# Patient Record
Sex: Female | Born: 1970 | Race: White | Hispanic: No | Marital: Single | Smoking: Never smoker
Health system: Southern US, Community
[De-identification: ages and names within clinical notes are randomized; demographics above are authoritative.]

## PROBLEM LIST (undated history)

## (undated) DIAGNOSIS — L270 Generalized skin eruption due to drugs and medicaments taken internally: Secondary | ICD-10-CM

## (undated) DIAGNOSIS — B9562 Methicillin resistant Staphylococcus aureus infection as the cause of diseases classified elsewhere: Secondary | ICD-10-CM

## (undated) DIAGNOSIS — F32A Depression, unspecified: Secondary | ICD-10-CM

## (undated) DIAGNOSIS — L0390 Cellulitis, unspecified: Secondary | ICD-10-CM

## (undated) DIAGNOSIS — E079 Disorder of thyroid, unspecified: Secondary | ICD-10-CM

## (undated) DIAGNOSIS — F329 Major depressive disorder, single episode, unspecified: Secondary | ICD-10-CM

## (undated) DIAGNOSIS — F419 Anxiety disorder, unspecified: Secondary | ICD-10-CM

## (undated) DIAGNOSIS — C801 Malignant (primary) neoplasm, unspecified: Secondary | ICD-10-CM

## (undated) HISTORY — DX: Cellulitis, unspecified: L03.90

## (undated) HISTORY — PX: BREAST SURGERY: SHX581

## (undated) HISTORY — DX: Malignant (primary) neoplasm, unspecified: C80.1

## (undated) HISTORY — PX: BILATERAL OOPHORECTOMY: SHX1221

## (undated) HISTORY — DX: Generalized skin eruption due to drugs and medicaments taken internally: L27.0

## (undated) HISTORY — DX: Major depressive disorder, single episode, unspecified: F32.9

## (undated) HISTORY — DX: Disorder of thyroid, unspecified: E07.9

## (undated) HISTORY — PX: ABDOMINAL HYSTERECTOMY: SHX81

## (undated) HISTORY — DX: Methicillin resistant Staphylococcus aureus infection as the cause of diseases classified elsewhere: B95.62

## (undated) HISTORY — DX: Anxiety disorder, unspecified: F41.9

## (undated) HISTORY — DX: Depression, unspecified: F32.A

---

## 2007-01-27 ENCOUNTER — Ambulatory Visit: Payer: Self-pay | Admitting: Otolaryngology

## 2007-02-01 ENCOUNTER — Ambulatory Visit: Payer: Self-pay | Admitting: Otolaryngology

## 2008-01-02 ENCOUNTER — Ambulatory Visit: Payer: Self-pay | Admitting: Unknown Physician Specialty

## 2008-03-24 ENCOUNTER — Emergency Department: Payer: Self-pay | Admitting: Emergency Medicine

## 2009-03-25 ENCOUNTER — Ambulatory Visit: Payer: Self-pay | Admitting: Oncology

## 2009-04-24 ENCOUNTER — Ambulatory Visit: Payer: Self-pay | Admitting: Oncology

## 2009-05-12 IMAGING — US US PELV - US TRANSVAGINAL
1 series · 17 of 25 positions shown · non-contrast
Comparison: none

REASON FOR EXAM: ATN BILA OVARIES   BRCA mutation
COMMENTS:

[Series 1: us pelv - us transvaginal · 17 of 64 slices shown]
[im 1/64]
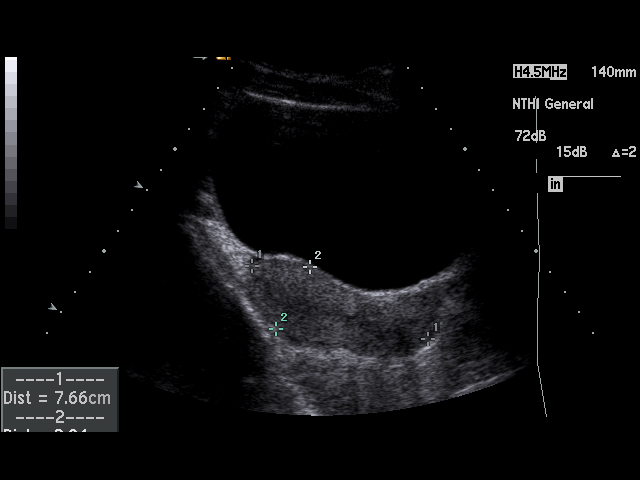
[im 6/64]
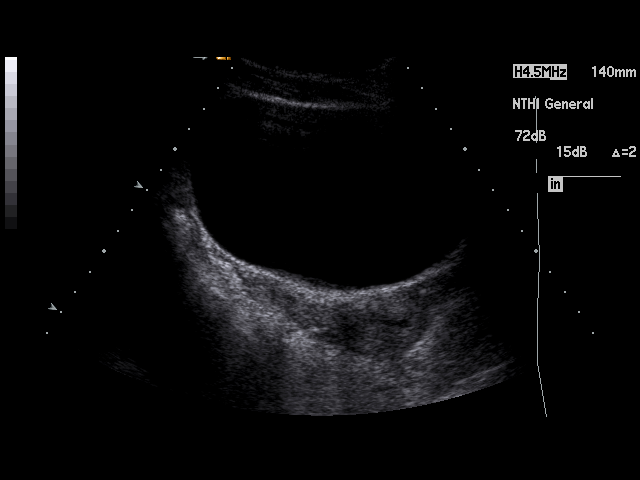
[im 8/64]
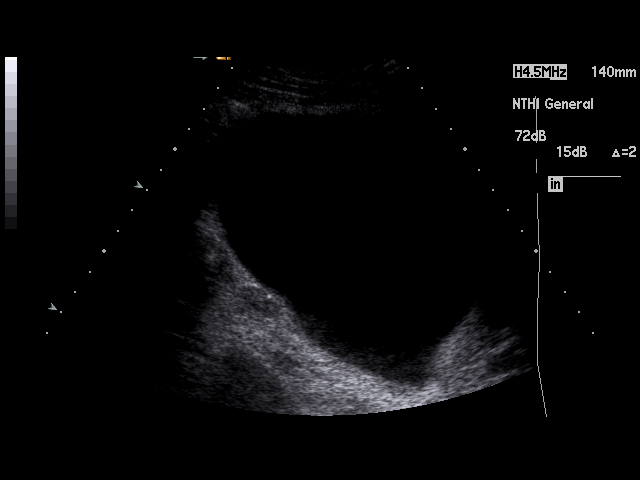
[im 14/64]
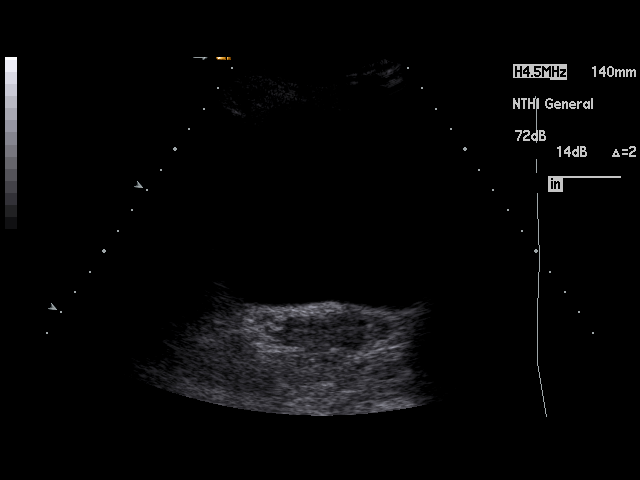
[im 16/64]
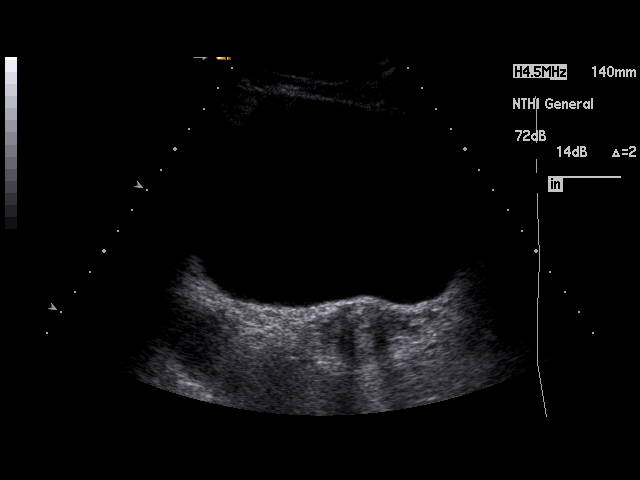
[im 22/64]
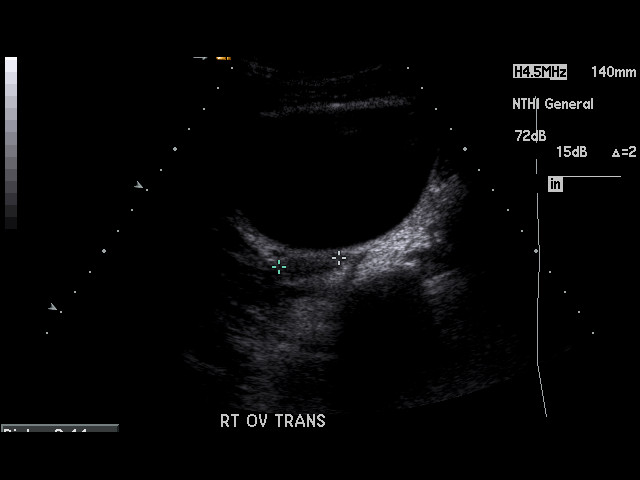
[im 24/64]
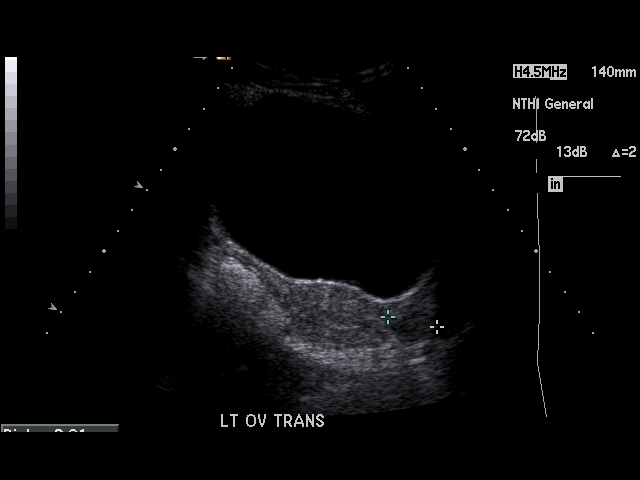
[im 29/64]
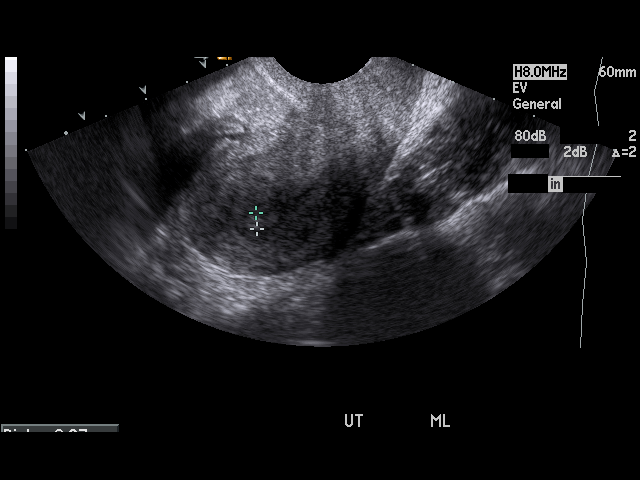
[im 32/64]
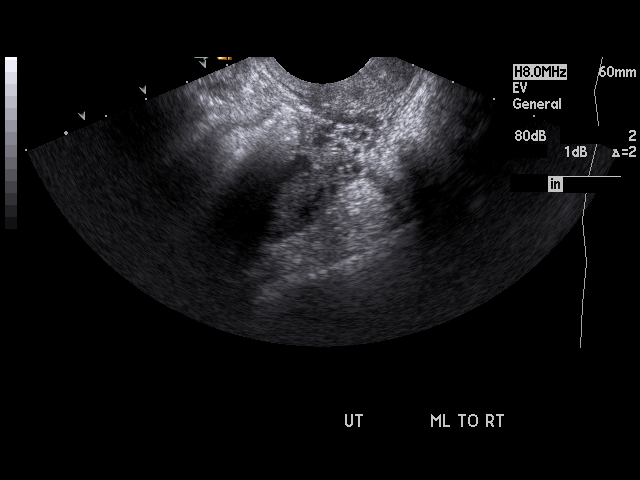
[im 35/64]
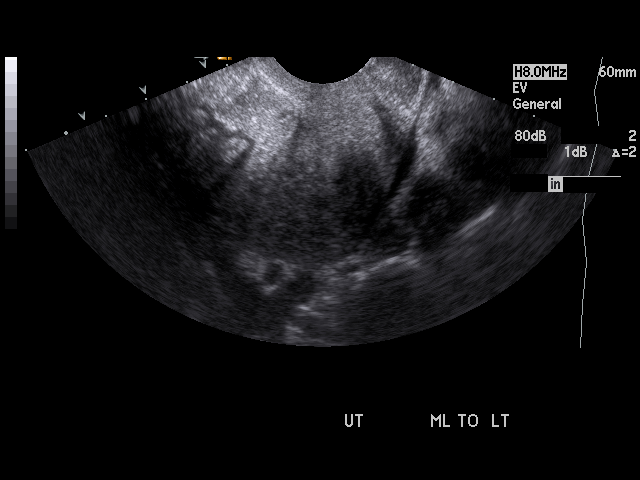
[im 40/64]
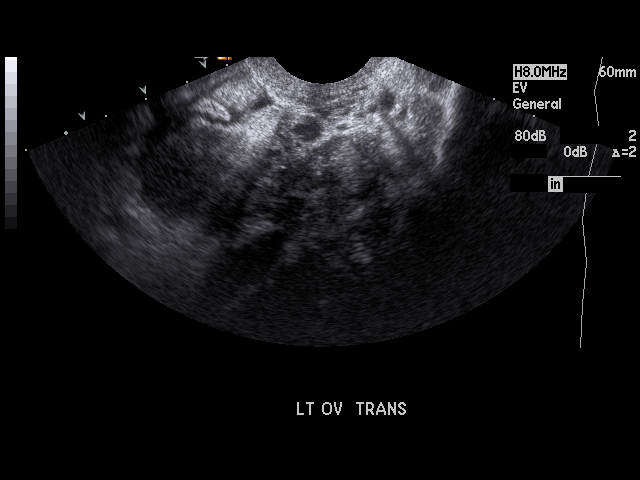
[im 43/64]
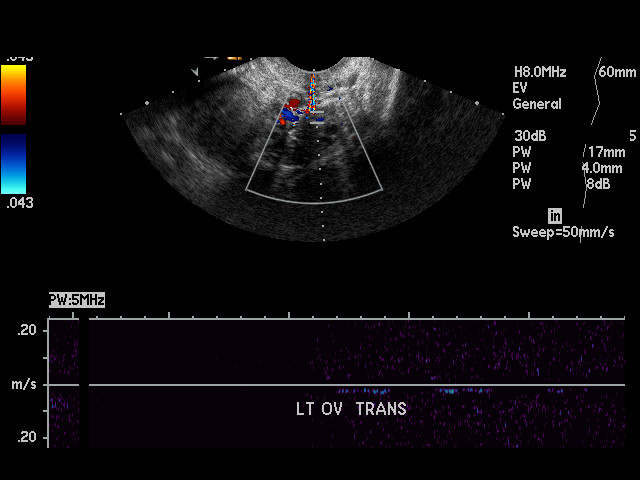
[im 48/64]
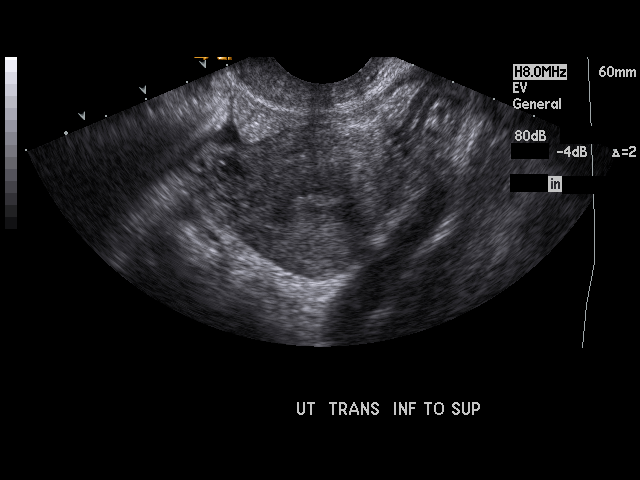
[im 50/64]
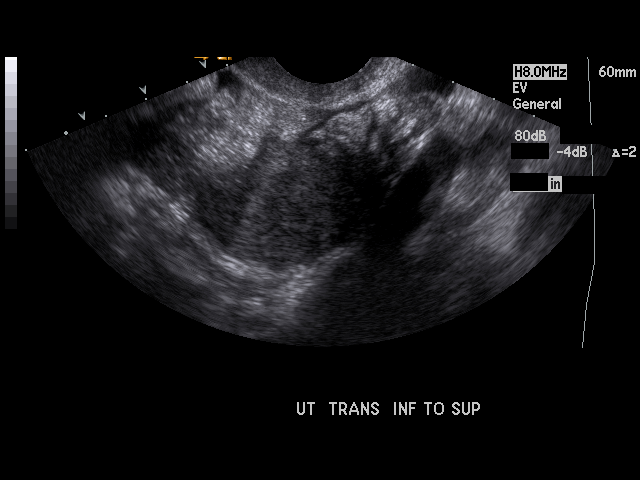
[im 56/64]
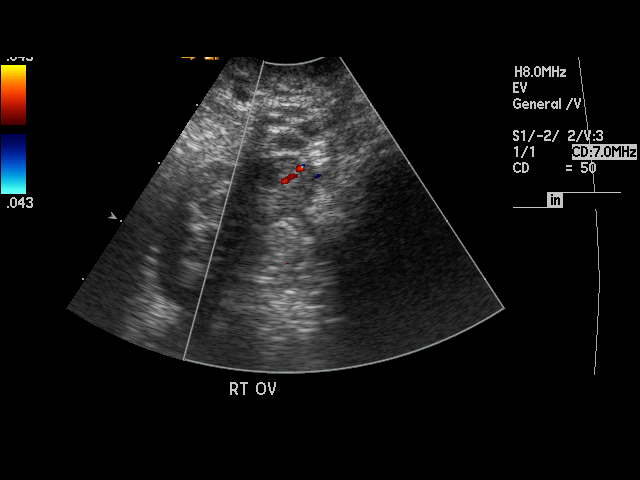
[im 58/64]
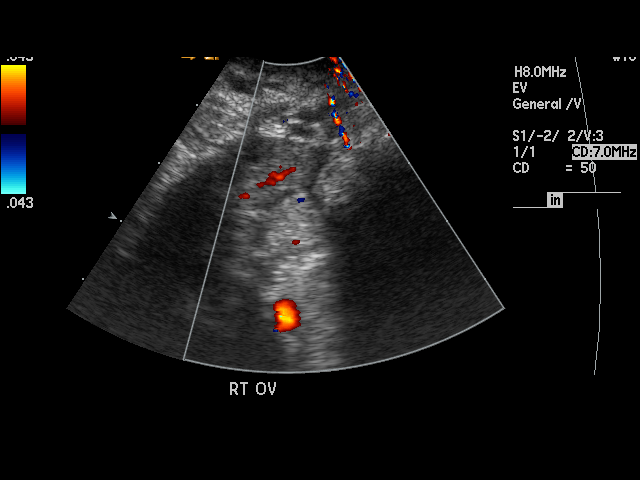
[im 64/64]
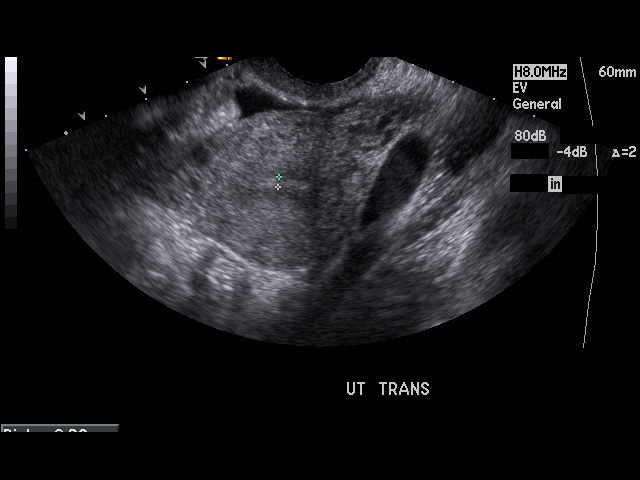

[17 of 25 positions shown; findings below may reference images not displayed]

PROCEDURE:     US  - US PELVIS EXAM W/TRANSVAGINAL  - [DATE]  [DATE]

RESULT:     Transabdominal and endovaginal ultrasound was performed. The
uterus measures 7.66 cm [92] cm x 4.14 cm. No uterine mass lesions are
seen. The endometrium measures 3.7 mm in thickness. The right and left
ovaries are visualized. The right ovary measures 1.85 cm at maximum diameter
and the left ovary measures 2.37 cm at maximum diameter. Vascular flow is
seen in each ovary. No abnormal adnexal masses are identified. No free fluid
is noted in the pelvis. The visualized portion of the urinary bladder is
normal in appearance. The right and left kidneys are visualized and show no
significant abnormalities.
IMPRESSION: No significant abnormalities are noted.

## 2009-05-25 ENCOUNTER — Ambulatory Visit: Payer: Self-pay | Admitting: Oncology

## 2009-06-25 ENCOUNTER — Ambulatory Visit: Payer: Self-pay | Admitting: Oncology

## 2011-02-23 DIAGNOSIS — L270 Generalized skin eruption due to drugs and medicaments taken internally: Secondary | ICD-10-CM

## 2011-02-23 DIAGNOSIS — B9562 Methicillin resistant Staphylococcus aureus infection as the cause of diseases classified elsewhere: Secondary | ICD-10-CM

## 2011-02-23 HISTORY — DX: Methicillin resistant Staphylococcus aureus infection as the cause of diseases classified elsewhere: B95.62

## 2011-02-23 HISTORY — DX: Generalized skin eruption due to drugs and medicaments taken internally: L27.0

## 2011-07-26 DIAGNOSIS — E079 Disorder of thyroid, unspecified: Secondary | ICD-10-CM

## 2011-07-26 HISTORY — DX: Disorder of thyroid, unspecified: E07.9

## 2011-09-01 ENCOUNTER — Ambulatory Visit: Payer: Self-pay | Admitting: Internal Medicine

## 2011-09-21 ENCOUNTER — Ambulatory Visit (INDEPENDENT_AMBULATORY_CARE_PROVIDER_SITE_OTHER): Payer: PRIVATE HEALTH INSURANCE | Admitting: Internal Medicine

## 2011-09-21 ENCOUNTER — Encounter: Payer: Self-pay | Admitting: Internal Medicine

## 2011-09-21 VITALS — BP 104/60 | HR 66 | Temp 98.4°F | Resp 14 | Ht 62.5 in | Wt 103.8 lb

## 2011-09-21 DIAGNOSIS — E8941 Symptomatic postprocedural ovarian failure: Secondary | ICD-10-CM

## 2011-09-21 DIAGNOSIS — B9562 Methicillin resistant Staphylococcus aureus infection as the cause of diseases classified elsewhere: Secondary | ICD-10-CM | POA: Insufficient documentation

## 2011-09-21 DIAGNOSIS — E8940 Asymptomatic postprocedural ovarian failure: Secondary | ICD-10-CM

## 2011-09-21 MED ORDER — ESTROGENS CONJUGATED 0.625 MG PO TABS: 0.6250 mg | ORAL_TABLET | Freq: Every day | ORAL | Status: DC

## 2011-09-21 MED ORDER — EPINEPHRINE 0.3 MG/0.3ML IJ DEVI: 0.3000 mg | Freq: Once | INTRAMUSCULAR | Status: AC
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
## 2011-09-22 ENCOUNTER — Encounter: Payer: Self-pay | Admitting: Internal Medicine

## 2011-09-22 DIAGNOSIS — L270 Generalized skin eruption due to drugs and medicaments taken internally: Secondary | ICD-10-CM | POA: Insufficient documentation

## 2011-09-22 DIAGNOSIS — F411 Generalized anxiety disorder: Secondary | ICD-10-CM | POA: Insufficient documentation

## 2011-09-22 DIAGNOSIS — F329 Major depressive disorder, single episode, unspecified: Secondary | ICD-10-CM | POA: Insufficient documentation

## 2011-09-22 DIAGNOSIS — F419 Anxiety disorder, unspecified: Secondary | ICD-10-CM | POA: Insufficient documentation

## 2012-06-05 ENCOUNTER — Encounter: Payer: Self-pay | Admitting: Internal Medicine

## 2012-06-05 ENCOUNTER — Ambulatory Visit: Payer: Self-pay | Admitting: Internal Medicine

## 2012-06-05 ENCOUNTER — Ambulatory Visit (INDEPENDENT_AMBULATORY_CARE_PROVIDER_SITE_OTHER): Payer: PRIVATE HEALTH INSURANCE | Admitting: Internal Medicine

## 2012-06-05 VITALS — BP 98/60 | HR 73 | Temp 98.0°F | Resp 14 | Wt 103.5 lb

## 2012-06-05 DIAGNOSIS — N760 Acute vaginitis: Secondary | ICD-10-CM | POA: Insufficient documentation

## 2012-06-05 DIAGNOSIS — Z803 Family history of malignant neoplasm of breast: Secondary | ICD-10-CM | POA: Insufficient documentation

## 2012-06-05 DIAGNOSIS — N898 Other specified noninflammatory disorders of vagina: Secondary | ICD-10-CM

## 2012-06-05 DIAGNOSIS — B9689 Other specified bacterial agents as the cause of diseases classified elsewhere: Secondary | ICD-10-CM | POA: Insufficient documentation

## 2012-06-05 DIAGNOSIS — M546 Pain in thoracic spine: Secondary | ICD-10-CM

## 2012-06-05 DIAGNOSIS — F329 Major depressive disorder, single episode, unspecified: Secondary | ICD-10-CM

## 2012-06-05 MED ORDER — NABUMETONE 750 MG PO TABS: 750.0000 mg | ORAL_TABLET | Freq: Two times a day (BID) | ORAL | Status: AC

## 2012-06-05 MED ORDER — METHOCARBAMOL 500 MG PO TABS: 500.0000 mg | ORAL_TABLET | Freq: Three times a day (TID) | ORAL | Status: AC

## 2012-06-05 NOTE — Assessment & Plan Note (Addendum)
Managed by Dr. Manson Passey with prozac. Major symptom is insomnia, managed with ambien.

## 2012-06-05 NOTE — Assessment & Plan Note (Addendum)
She has scoliosis and rhomboid muscle spasm on exam.  Trial of relafen and MR for use prior to massage .  Thoracic spine films ordered.  Will rec PT to strengthen muscles.

## 2012-06-05 NOTE — Progress Notes (Addendum)
Patient ID: Jill Mays, female   DOB: April 05, 1971, 41 y.o.   MRN: [id number]  Patient Active Problem List  Diagnosis  . Cellulitis due to methicillin-resistant Staphylococcus aureus (MRSA)  . 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 Depression  . Anxiety  . breast cancer  . Back pain, thoracic  . Vaginitis    Subjective:  CC:   Chief Complaint  Patient presents with  . Back Pain  . Vaginal Discharge    HPI:   Jill Mays a 41 y.o. female who presents with vaginal discharge for the past week without pruritus .  No recent sexual activity and antibiotic use.   2) persistent back pain since her mastectomy.  Pain is located in her scapular region, paraspinous muscles affected.  No radiation .  She has tried massage and NSAIDs without real relief.  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    Past Medical History  Diagnosis Date  . Cellulitis due to methicillin-resistant Staphylococcus aureus (MRSA) May 2012    of breast (post reconstruction) presumed Strep or Staph  . 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 Thyroid disease Oct 2012    elevated TSH per patient   . Depression   . Anxiety   . breast cancer     s/p bilateral mastectomy TAH/BSO    Past Surgical History  Procedure Date  . Abdominal hysterectomy   . Breast surgery   . Bilateral oophorectomy          The following portions of the patient's history were reviewed and updated as appropriate: Allergies, current medications, and problem list.    Review of Systems:   12 Pt  review of systems was negative except those addressed in the HPI,     History   Social History  . Marital Status: Divorced    Spouse Name: N/A    Number of Children: N/A  . Years of Education: N/A   Occupational History  . Not on file.   Social History Main Topics  . Smoking status: Never Smoker   . Smokeless tobacco: Never Used  . Alcohol Use: Yes  . Drug Use: No   . Sexually Active: Not on file   Other Topics Concern  . Not on file   Social History Narrative  . No narrative on file    Objective:  BP 98/60  Pulse 73  Temp 98 F (36.7 C) (Oral)  Resp 14  Wt 103 lb 8 oz (46.947 kg)  SpO2 98%  General appearance: alert, cooperative and appears stated age Back: mild scoliosis to the left. paraspinous muscle spasm, rhomboid muscle spasm.  Lungs: clear to auscultation bilaterally Heart: regular rate and rhythm, S1, S2 normal, no murmur, click, rub or gallop Abdomen: soft, non-tender; bowel sounds normal; no masses,  no organomegaly Pulses: 2+ and symmetric Skin: Skin color, texture, turgor normal. No rashes or lesions GYN: vaginal vault without erythema. White thin discharge, slight fishy odor. Lymph nodes: Cervical, supraclavicular, and axillary nodes normal.  Assessment and Plan:  Back pain, thoracic She has scoliosis and rhomboid muscle spasm on exam.  Trial of relafen and MR for use prior to massage .  Thoracic spine films ordered.  Will rec PT to strengthen muscles.   Depression Managed by Dr. Manson Passey with prozac. Major symptom is insomnia, managed with ambien.   Vaginitis No history of sexual activity or antibiotics,  Likely gardnerella, but  will wait for culture.    Updated Medication List Outpatient Encounter Prescriptions as of 06/05/2012  Medication Sig Dispense Refill  . Ascorbic Acid (VITAMIN C) 100 MG tablet Take 100 mg by mouth daily.        . Calcium Carbonate-Vitamin D (CALCIUM + D) 600-200 MG-UNIT TABS Take by mouth.        . EPINEPHrine (EPIPEN) 0.3 mg/0.3 mL DEVI Inject 0.3 mLs (0.3 mg total) into the muscle once.  1 Device  1  . estrogens, conjugated, (PREMARIN) 0.625 MG tablet Take 1 tablet (0.625 mg total) by mouth daily. Take daily for 21 days then do not take for 7 days.  90 tablet  3  . FLUoxetine (PROZAC) 40 MG capsule Take 40 mg by mouth daily.        . Multiple Vitamin (MULTIVITAMIN) tablet Take 1 tablet by  mouth daily.        Marland Kitchen zolpidem (AMBIEN) 5 MG tablet Take 5 mg by mouth at bedtime as needed.        . methocarbamol (ROBAXIN) 500 MG tablet Take 1 tablet (500 mg total) by mouth 3 (three) times daily. As needed for muscle spasm  30 tablet  1  . nabumetone (RELAFEN) 750 MG tablet Take 1 tablet (750 mg total) by mouth 2 (two) times daily.  60 tablet  2     Orders Placed This Encounter  Procedures  . Culture, routine-genital  . Culture, routine-genital  . DG Thoracic Spine W/Swimmers    No Follow-up on file.

## 2012-06-05 NOTE — Assessment & Plan Note (Signed)
No history of sexual activity or antibiotics,  Likely gardnerella, but will wait for culture.

## 2012-06-06 ENCOUNTER — Telehealth: Payer: Self-pay | Admitting: Internal Medicine

## 2012-06-06 DIAGNOSIS — M546 Pain in thoracic spine: Secondary | ICD-10-CM

## 2012-06-06 NOTE — Telephone Encounter (Signed)
Left message notifying patient.

## 2012-06-06 NOTE — Telephone Encounter (Signed)
Thoracic spien films normal. .  PT referral in process.

## 2012-06-15 ENCOUNTER — Encounter: Payer: Self-pay | Admitting: Internal Medicine

## 2012-06-19 ENCOUNTER — Encounter: Payer: Self-pay | Admitting: Internal Medicine

## 2012-06-25 ENCOUNTER — Encounter: Payer: Self-pay | Admitting: Internal Medicine

## 2012-07-25 ENCOUNTER — Encounter: Payer: Self-pay | Admitting: Internal Medicine

## 2012-08-01 ENCOUNTER — Telehealth: Payer: Self-pay | Admitting: Internal Medicine

## 2012-08-01 NOTE — Telephone Encounter (Signed)
Pt came in today and needed refill on  prozac (generic) 40mg  [facility] Pt is compeltely out of her meds Has been trying to leave voice mail on refill

## 2012-08-02 MED ORDER — FLUOXETINE HCL 40 MG PO CAPS: 40.0000 mg | ORAL_CAPSULE | Freq: Every day | ORAL | Status: DC

## 2012-08-02 NOTE — Telephone Encounter (Signed)
90 days with one refill sent to [facility]

## 2012-08-30 ENCOUNTER — Encounter: Payer: Self-pay | Admitting: Internal Medicine

## 2012-08-30 ENCOUNTER — Ambulatory Visit (INDEPENDENT_AMBULATORY_CARE_PROVIDER_SITE_OTHER): Payer: PRIVATE HEALTH INSURANCE | Admitting: Internal Medicine

## 2012-08-30 VITALS — BP 112/60 | HR 65 | Temp 97.8°F | Resp 12 | Ht 62.5 in | Wt 105.5 lb

## 2012-08-30 DIAGNOSIS — M546 Pain in thoracic spine: Secondary | ICD-10-CM

## 2012-08-30 DIAGNOSIS — F32A Depression, unspecified: Secondary | ICD-10-CM

## 2012-08-30 DIAGNOSIS — F419 Anxiety disorder, unspecified: Secondary | ICD-10-CM

## 2012-08-30 DIAGNOSIS — E8940 Asymptomatic postprocedural ovarian failure: Secondary | ICD-10-CM

## 2012-08-30 DIAGNOSIS — F411 Generalized anxiety disorder: Secondary | ICD-10-CM

## 2012-08-30 DIAGNOSIS — F329 Major depressive disorder, single episode, unspecified: Secondary | ICD-10-CM

## 2012-08-30 DIAGNOSIS — E8941 Symptomatic postprocedural ovarian failure: Secondary | ICD-10-CM

## 2012-08-30 MED ORDER — FLUOXETINE HCL 40 MG PO CAPS: 40.0000 mg | ORAL_CAPSULE | Freq: Every day | ORAL | Status: DC

## 2012-08-30 MED ORDER — ESTROGENS CONJUGATED 0.625 MG PO TABS: 0.6250 mg | ORAL_TABLET | Freq: Every day | ORAL | Status: DC

## 2012-08-30 MED ORDER — ZOLPIDEM TARTRATE 5 MG PO TABS: 5.0000 mg | ORAL_TABLET | Freq: Every evening | ORAL | Status: DC | PRN

## 2012-08-30 NOTE — Progress Notes (Signed)
Patient ID: Jill Mays, female   DOB: 11/01/1970, 41 y.o.   MRN: [id number]    Patient Active Problem List  Diagnosis  . 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 Depression  . Anxiety  . breast cancer  . Back pain, thoracic  . Vaginitis    Subjective:  CC:   Chief Complaint  Patient presents with  . Follow-up    HPI:   Jill Mays a 41 y.o. female who presents Followup on chronic issues. Her previously reported back pain has improved with physical therapy and with mitigation of stress of selling her house. She has been living in an apartment with her son and the absence of electronic media has proven to be very restful for both of them. 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    Past Medical History  Diagnosis Date  . Cellulitis due to methicillin-resistant Staphylococcus aureus (MRSA) May 2012    of breast (post reconstruction) presumed Strep or Staph  . 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 Thyroid disease Oct 2012    elevated TSH per patient   . Depression   . Anxiety   . breast cancer     s/p bilateral mastectomy TAH/BSO    Past Surgical History  Procedure Date  . Abdominal hysterectomy   . Breast surgery   . Bilateral oophorectomy          The following portions of the patient's history were reviewed and updated as appropriate: Allergies, current medications, and problem list.    Review of Systems:   12 Pt  review of systems was negative except those addressed in the HPI,     History   Social History  . Marital Status: Divorced    Spouse Name: N/A    Number of Children: N/A  . Years of Education: N/A   Occupational History  . Not on file.   Social History Main Topics  . Smoking status: Never Smoker   . Smokeless tobacco: Never Used  . Alcohol Use: Yes  . Drug Use: No  .  Sexually Active: Not on file   Other Topics Concern  . Not on file   Social History Narrative  . No narrative on file    Objective:  BP 112/60  Pulse 65  Temp 97.8 F (36.6 C) (Oral)  Resp 12  Ht 5' 2.5" (1.588 m)  Wt 105 lb 8 oz (47.854 kg)  BMI 18.99 kg/m2  SpO2 98%  General appearance: alert, cooperative and appears stated age Ears: normal TM's and external ear canals both ears Throat: lips, mucosa, and tongue normal; teeth and gums normal Neck: no adenopathy, no carotid bruit, supple, symmetrical, trachea midline and thyroid not enlarged, symmetric, no tenderness/mass/nodules Back: symmetric, no curvature. ROM normal. No CVA tenderness. Lungs: clear to auscultation bilaterally Heart: regular rate and rhythm, S1, S2 normal, no murmur, click, rub or gallop Abdomen: soft, non-tender; bowel sounds normal; no masses,  no organomegaly Pulses: 2+ and symmetric Skin: Skin color, texture, turgor normal. No rashes or lesions Lymph nodes: Cervical, supraclavicular, and axillary nodes normal.  Assessment and Plan:  Back pain, thoracic Resolved  Depression Currently well-controlled on current medications. She plans to wean herself off of her medication while she is in [place]  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 Refill and alprazolam just for when necessary use.   Updated Medication List Outpatient Encounter Prescriptions as of 08/30/2012  Medication Sig Dispense Refill  . Ascorbic Acid (VITAMIN C) 100 MG tablet Take 100 mg by mouth daily.        . Calcium Carbonate-Vitamin D (CALCIUM + D) 600-200 MG-UNIT TABS Take by mouth.        . EPINEPHrine (EPIPEN) 0.3 mg/0.3 mL DEVI Inject 0.3 mLs (0.3 mg total) into the muscle once.  1 Device  1  . estrogens, conjugated, (PREMARIN) 0.625 MG tablet Take 1 tablet (0.625 mg total) by mouth daily.  90 tablet  3  . FLUoxetine (PROZAC) 40 MG capsule Take 1 capsule (40 mg total) by  mouth daily.  30 capsule  11  . Multiple Vitamin (MULTIVITAMIN) tablet Take 1 tablet by mouth daily.        . nabumetone (RELAFEN) 750 MG tablet Take 1 tablet (750 mg total) by mouth 2 (two) times daily.  60 tablet  2  . zolpidem (AMBIEN) 5 MG tablet Take 1 tablet (5 mg total) by mouth at bedtime as needed.  30 tablet  5  . [DISCONTINUED] estrogens, conjugated, (PREMARIN) 0.625 MG tablet Take 1 tablet (0.625 mg total) by mouth daily. Take daily for 21 days then do not take for 7 days.  90 tablet  3  . [DISCONTINUED] estrogens, conjugated, (PREMARIN) 0.625 MG tablet Take 1 tablet (0.625 mg total) by mouth daily.  90 tablet  3  . [DISCONTINUED] FLUoxetine (PROZAC) 40 MG capsule Take 1 capsule (40 mg total) by mouth daily.  90 capsule  1  . [DISCONTINUED] zolpidem (AMBIEN) 5 MG tablet Take 5 mg by mouth at bedtime as needed.           No orders of the defined types were placed in this encounter.    No Follow-up on file.

## 2012-08-31 NOTE — Assessment & Plan Note (Addendum)
Currently well-controlled on current medications. She plans to wean herself off of her medication while she is in [place]

## 2012-08-31 NOTE — Assessment & Plan Note (Signed)
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 Refill and alprazolam just for when necessary use.

## 2012-08-31 NOTE — Assessment & Plan Note (Signed)
Resolved

## 2013-05-28 ENCOUNTER — Other Ambulatory Visit: Payer: Self-pay | Admitting: Internal Medicine

## 2013-05-28 NOTE — Telephone Encounter (Signed)
No appt since 08/30/12 & future appt scheduled-please advise

## 2013-08-04 ENCOUNTER — Other Ambulatory Visit: Payer: Self-pay | Admitting: Internal Medicine

## 2013-10-26 ENCOUNTER — Encounter: Payer: Self-pay | Admitting: Internal Medicine

## 2013-10-26 ENCOUNTER — Ambulatory Visit (INDEPENDENT_AMBULATORY_CARE_PROVIDER_SITE_OTHER): Payer: BC Managed Care – PPO | Admitting: Internal Medicine

## 2013-10-26 ENCOUNTER — Encounter (INDEPENDENT_AMBULATORY_CARE_PROVIDER_SITE_OTHER): Payer: Self-pay

## 2013-10-26 VITALS — BP 100/68 | HR 68 | Temp 97.9°F | Wt 107.0 lb

## 2013-10-26 DIAGNOSIS — Z23 Encounter for immunization: Secondary | ICD-10-CM

## 2013-10-26 DIAGNOSIS — F329 Major depressive disorder, single episode, unspecified: Secondary | ICD-10-CM

## 2013-10-26 DIAGNOSIS — F3289 Other specified depressive episodes: Secondary | ICD-10-CM

## 2013-10-26 DIAGNOSIS — F32A Depression, unspecified: Secondary | ICD-10-CM

## 2013-10-26 DIAGNOSIS — R5381 Other malaise: Secondary | ICD-10-CM

## 2013-10-26 DIAGNOSIS — Z0001 Encounter for general adult medical examination with abnormal findings: Secondary | ICD-10-CM | POA: Insufficient documentation

## 2013-10-26 DIAGNOSIS — Z113 Encounter for screening for infections with a predominantly sexual mode of transmission: Secondary | ICD-10-CM | POA: Insufficient documentation

## 2013-10-26 DIAGNOSIS — Z79899 Other long term (current) drug therapy: Secondary | ICD-10-CM

## 2013-10-26 DIAGNOSIS — R5383 Other fatigue: Secondary | ICD-10-CM

## 2013-10-26 DIAGNOSIS — Z0000 Encounter for general adult medical examination without abnormal findings: Secondary | ICD-10-CM | POA: Insufficient documentation

## 2013-10-26 LAB — COMPREHENSIVE METABOLIC PANEL
ALT: 21 U/L (ref 0–35)
AST: 24 U/L (ref 0–37)
Albumin: 4 g/dL (ref 3.5–5.2)
Alkaline Phosphatase: 72 U/L (ref 39–117)
BILIRUBIN TOTAL: 0.2 mg/dL — AB (ref 0.3–1.2)
BUN: 14 mg/dL (ref 6–23)
CALCIUM: 9.3 mg/dL (ref 8.4–10.5)
CHLORIDE: 104 meq/L (ref 96–112)
CO2: 28 meq/L (ref 19–32)
CREATININE: 0.9 mg/dL (ref 0.4–1.2)
GFR: 74.17 mL/min (ref >60.00)
Glucose, Bld: 82 mg/dL (ref 70–99)
Potassium: 3.8 mEq/L (ref 3.5–5.1)
Sodium: 139 mEq/L (ref 135–145)
Total Protein: 7.1 g/dL (ref 6.0–8.3)

## 2013-10-26 LAB — TSH: TSH: 1.86 u[IU]/mL (ref 0.35–5.50)

## 2013-10-26 LAB — CBC WITH DIFFERENTIAL/PLATELET
Basophils Absolute: 0 10*3/uL (ref 0.0–0.1)
Basophils Relative: 0.9 % (ref 0.0–3.0)
EOS PCT: 4.3 % (ref 0.0–5.0)
Eosinophils Absolute: 0.2 10*3/uL (ref 0.0–0.7)
HEMATOCRIT: 37.5 % (ref 36.0–46.0)
HEMOGLOBIN: 12.8 g/dL (ref 12.0–15.0)
LYMPHS ABS: 2.5 10*3/uL (ref 0.7–4.0)
Lymphocytes Relative: 45.2 % (ref 12.0–46.0)
MCHC: 34 g/dL (ref 30.0–36.0)
MCV: 89 fl (ref 78.0–100.0)
Monocytes Absolute: 0.4 10*3/uL (ref 0.1–1.0)
Monocytes Relative: 7 % (ref 3.0–12.0)
NEUTROS ABS: 2.3 10*3/uL (ref 1.4–7.7)
Neutrophils Relative %: 42.6 % — ABNORMAL LOW (ref 43.0–77.0)
Platelets: 253 10*3/uL (ref 150.0–400.0)
RBC: 4.22 Mil/uL (ref 3.87–5.11)
RDW: 12.6 % (ref 11.5–14.6)
WBC: 5.5 10*3/uL (ref 4.5–10.5)

## 2013-10-26 LAB — LIPID PANEL
CHOL/HDL RATIO: 3
Cholesterol: 192 mg/dL (ref 0–200)
HDL: 56.6 mg/dL (ref >39.00)
LDL Cholesterol: 116 mg/dL — ABNORMAL HIGH (ref 0–99)
TRIGLYCERIDES: 95 mg/dL (ref 0.0–149.0)
VLDL: 19 mg/dL (ref 0.0–40.0)

## 2013-10-26 MED ORDER — ZOLPIDEM TARTRATE 5 MG PO TABS: 5.0000 mg | ORAL_TABLET | Freq: Every day | ORAL | Status: DC

## 2013-10-26 MED ORDER — ESTROGENS CONJUGATED 0.625 MG PO TABS: ORAL_TABLET | ORAL | Status: DC

## 2013-10-26 MED ORDER — FLUOXETINE HCL 20 MG PO TABS: 20.0000 mg | ORAL_TABLET | Freq: Every day | ORAL | Status: DC
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
## 2013-10-26 NOTE — Progress Notes (Signed)
Pre-visit discussion using our clinic review tool. No additional management support is needed unless otherwise documented below in the visit note.  

## 2013-10-28 NOTE — Assessment & Plan Note (Signed)
Improved but not resolved. Continue reduced dose of prozac and low dose ambien.

## 2013-10-28 NOTE — Assessment & Plan Note (Signed)
Annual comprehensive exam was done excluding breast, pelvic and PAP smear. All screenings have been addressed .  

## 2013-10-29 ENCOUNTER — Encounter: Payer: Self-pay | Admitting: *Deleted

## 2014-04-19 ENCOUNTER — Other Ambulatory Visit: Payer: Self-pay | Admitting: Internal Medicine

## 2014-04-19 NOTE — Telephone Encounter (Signed)
Please advise ok to fill last OV 1/15

## 2014-11-06 ENCOUNTER — Other Ambulatory Visit: Payer: Self-pay | Admitting: Internal Medicine

## 2014-11-06 NOTE — Telephone Encounter (Signed)
Last OV 1.2.15, next OV 2.18.16.  Please advise refill

## 2014-11-06 NOTE — Telephone Encounter (Signed)
Refill one 30 days only.  Has not been seen in over a year so needs office visit prior to any more refills

## 2014-11-11 ENCOUNTER — Other Ambulatory Visit: Payer: Self-pay | Admitting: Internal Medicine

## 2014-11-12 NOTE — Telephone Encounter (Signed)
Last appt 10/26/13. Has appt scheduled 12/12/14

## 2014-11-13 NOTE — Telephone Encounter (Signed)
Refill one 30 days only.  Has not been seen in one year so she needs a  30 minute visit.

## 2014-11-14 NOTE — Telephone Encounter (Signed)
Called to pharmacy 

## 2014-12-12 ENCOUNTER — Encounter (INDEPENDENT_AMBULATORY_CARE_PROVIDER_SITE_OTHER): Payer: Self-pay

## 2014-12-12 ENCOUNTER — Ambulatory Visit (INDEPENDENT_AMBULATORY_CARE_PROVIDER_SITE_OTHER): Payer: BC Managed Care – PPO | Admitting: Internal Medicine

## 2014-12-12 ENCOUNTER — Encounter: Payer: Self-pay | Admitting: Internal Medicine

## 2014-12-12 VITALS — BP 98/58 | HR 69 | Temp 98.0°F | Resp 14 | Ht 63.0 in | Wt 109.0 lb

## 2014-12-12 DIAGNOSIS — Z1159 Encounter for screening for other viral diseases: Secondary | ICD-10-CM

## 2014-12-12 DIAGNOSIS — Z13820 Encounter for screening for osteoporosis: Secondary | ICD-10-CM

## 2014-12-12 DIAGNOSIS — Z79899 Other long term (current) drug therapy: Secondary | ICD-10-CM

## 2014-12-12 DIAGNOSIS — Z803 Family history of malignant neoplasm of breast: Secondary | ICD-10-CM

## 2014-12-12 DIAGNOSIS — E559 Vitamin D deficiency, unspecified: Secondary | ICD-10-CM

## 2014-12-12 DIAGNOSIS — Z0000 Encounter for general adult medical examination without abnormal findings: Secondary | ICD-10-CM

## 2014-12-12 DIAGNOSIS — Z780 Asymptomatic menopausal state: Secondary | ICD-10-CM

## 2014-12-12 LAB — HEPATIC FUNCTION PANEL
ALK PHOS: 75 U/L (ref 39–117)
ALT: 12 U/L (ref 0–35)
AST: 17 U/L (ref 0–37)
Albumin: 4.2 g/dL (ref 3.5–5.2)
Bilirubin, Direct: 0.1 mg/dL (ref 0.0–0.3)
Total Bilirubin: 0.3 mg/dL (ref 0.2–1.2)
Total Protein: 7.3 g/dL (ref 6.0–8.3)

## 2014-12-12 LAB — VITAMIN D 25 HYDROXY (VIT D DEFICIENCY, FRACTURES): VITD: 47.73 ng/mL (ref 30.00–100.00)

## 2014-12-12 MED ORDER — FLUOXETINE HCL 20 MG PO CAPS: 20.0000 mg | ORAL_CAPSULE | Freq: Every day | ORAL | Status: DC

## 2014-12-12 MED ORDER — ZOLPIDEM TARTRATE 5 MG PO TABS: 5.0000 mg | ORAL_TABLET | Freq: Every day | ORAL | Status: DC

## 2014-12-12 MED ORDER — ESTROGENS CONJUGATED 0.625 MG PO TABS: ORAL_TABLET | ORAL | Status: DC

## 2014-12-12 NOTE — Progress Notes (Signed)
Pre-visit discussion using our clinic review tool. No additional management support is needed unless otherwise documented below in the visit note.  

## 2014-12-12 NOTE — Patient Instructions (Signed)
I recommend getting the majority of your calcium and Vitamin D  through diet rather than supplements given the recent association of calcium supplements with increased coronary artery calcium scores (You need 1200 mg daily )   Unsweetened almond/coconut milk is a great low calorie low carb, cholesterol free  way to increase your dietary calcium and vitamin D.  Try the blue Jackquline Bosch  DEXA to be scheduled at a [facility] Maintenance Adopting a healthy lifestyle and getting preventive care can go a long way to promote health and wellness. Talk with your health care provider about what schedule of regular examinations is right for you. This is a good chance for you to check in with your provider about disease prevention and staying healthy. In between checkups, there are plenty of things you can do on your own. Experts have done a lot of research about which lifestyle changes and preventive measures are most likely to keep you healthy. Ask your health care provider for more information. WEIGHT AND DIET  Eat a healthy diet  Be sure to include plenty of vegetables, fruits, low-fat dairy products, and lean protein.  Do not eat a lot of foods high in solid fats, added sugars, or salt.  Get regular exercise. This is one of the most important things you can do for your health.  Most adults should exercise for at least 150 minutes each week. The exercise should increase your heart rate and make you sweat (moderate-intensity exercise).  Most adults should also do strengthening exercises at least twice a week. This is in addition to the moderate-intensity exercise.  Maintain a healthy weight  Body mass index (BMI) is a measurement that can be used to identify possible weight problems. It estimates body fat based on height and weight. Your health care provider can help determine your BMI and help you achieve or maintain a healthy weight.  For females 94 years of age and older:   A BMI  below 18.5 is considered underweight.  A BMI of 18.5 to 24.9 is normal.  A BMI of 25 to 29.9 is considered overweight.  A BMI of 30 and above is considered obese.  Watch levels of cholesterol and blood lipids  You should start having your blood tested for lipids and cholesterol at 45 years of age, then have this test every 5 years.  You may need to have your cholesterol levels checked more often if:  Your lipid or cholesterol levels are high.  You are older than 46 years of age.  You are at high risk for heart disease.  CANCER SCREENING   Lung Cancer  Lung cancer screening is recommended for adults 37-36 years old who are at high risk for lung cancer because of a history of smoking.  A yearly low-dose CT scan of the lungs is recommended for people who:  Currently smoke.  Have quit within the past 15 years.  Have at least a 30-pack-year history of smoking. A pack year is smoking an average of one pack of cigarettes a day for 1 year.  Yearly screening should continue until it has been 15 years since you quit.  Yearly screening should stop if you develop a health problem that would prevent you from having lung cancer treatment.    CERVICAL CANCER  If you had a hysterectomy for a problem that was not cancer or a condition that could lead to cancer, then you no longer need Pap tests.  If you  are older than 65 years, and you have had normal Pap tests for the past 10 years, you no longer need to have Pap tests.  If you have had past treatment for cervical cancer or a condition that could lead to cancer, you need Pap tests and screening for cancer for at least 20 years after your treatment.  If you no longer get a Pap test, assess your risk factors if they change (such as having a new sexual partner). This can affect whether you should start being screened again.  Some women have medical problems that increase their chance of getting cervical cancer. If this is the case for  you, your health care provider may recommend more frequent screening and Pap tests.  The human papillomavirus (HPV) test is another test that may be used for cervical cancer screening. The HPV test looks for the virus that can cause cell changes in the cervix. The cells collected during the Pap test can be tested for HPV.  The HPV test can be used to screen women 2 years of age and older. Getting tested for HPV can extend the interval between normal Pap tests from three to five years.  An HPV test also should be used to screen women of any age who have unclear Pap test results.  After 46 years of age, women should have HPV testing as often as Pap tests.  Colorectal Cancer  This type of cancer can be detected and often prevented.  Routine colorectal cancer screening usually begins at 46 years of age and continues through 46 years of age.  Your health care provider may recommend screening at an earlier age if you have risk factors for colon cancer.  Your health care provider may also recommend using home test kits to check for hidden blood in the stool.  A small camera at the end of a tube can be used to examine your colon directly (sigmoidoscopy or colonoscopy). This is done to check for the earliest forms of colorectal cancer.  Routine screening usually begins at age 59.  Direct examination of the colon should be repeated every 5-10 years through 46 years of age. However, you may need to be screened more often if early forms of precancerous polyps or small growths are found. Skin Cancer  Check your skin from head to toe regularly.  Tell your health care provider about any new moles or changes in moles, especially if there is a change in a mole's shape or color.  Also tell your health care provider if you have a mole that is larger than the size of a pencil eraser.  Always use sunscreen. Apply sunscreen liberally and repeatedly throughout the day.  Protect yourself by wearing long  sleeves, pants, a wide-brimmed hat, and sunglasses whenever you are outside. HEART DISEASE, DIABETES, AND HIGH BLOOD PRESSURE   Have your blood pressure checked at least every 1-2 years. High blood pressure causes heart disease and increases the risk of stroke.  If you are between 27 years and 42 years old, ask your health care provider if you should take aspirin to prevent strokes.  Have regular diabetes screenings. This involves taking a blood sample to check your fasting blood sugar level.  If you are at a normal weight and have a low risk for diabetes, have this test once every three years after 46 years of age.  If you are overweight and have a high risk for diabetes, consider being tested at a younger age or more  often. PREVENTING INFECTION  Hepatitis B  If you have a higher risk for hepatitis B, you should be screened for this virus. You are considered at high risk for hepatitis B if:  You were born in a country where hepatitis B is common. Ask your health care provider which countries are considered high risk.  Your parents were born in a high-risk country, and you have not been immunized against hepatitis B (hepatitis B vaccine).  You have HIV or AIDS.  You use needles to inject street drugs.  You live with someone who has hepatitis B.  You have had sex with someone who has hepatitis B.  You get hemodialysis treatment.  You take certain medicines for conditions, including cancer, organ transplantation, and autoimmune conditions. Hepatitis C  Blood testing is recommended for:  Everyone born from [birth year] through 1967.  Anyone with known risk factors for hepatitis C. Sexually transmitted infections (STIs)  You should be screened for sexually transmitted infections (STIs) including gonorrhea and chlamydia if:  You are sexually active and are younger than 46 years of age.  You are older than 46 years of age and your health care provider tells you that you are at risk for  this type of infection.  Your sexual activity has changed since you were last screened and you are at an increased risk for chlamydia or gonorrhea. Ask your health care provider if you are at risk.  If you do not have HIV, but are at risk, it may be recommended that you take a prescription medicine daily to prevent HIV infection. This is called pre-exposure prophylaxis (PrEP). You are considered at risk if:  You are sexually active and do not regularly use condoms or know the HIV status of your partner(s).  You take drugs by injection.  You are sexually active with a partner who has HIV. Talk with your health care provider about whether you are at high risk of being infected with HIV. If you choose to begin PrEP, you should first be tested for HIV. You should then be tested every 3 months for as long as you are taking PrEP.  PREGNANCY   If you are premenopausal and you may become pregnant, ask your health care provider about preconception counseling.  If you may become pregnant, take 400 to 800 micrograms (mcg) of folic acid every day.  If you want to prevent pregnancy, talk to your health care provider about birth control (contraception). OSTEOPOROSIS AND MENOPAUSE   Osteoporosis is a disease in which the bones lose minerals and strength with aging. This can result in serious bone fractures. Your risk for osteoporosis can be identified using a bone density scan.  If you are 75 years of age or older, or if you are at risk for osteoporosis and fractures, ask your health care provider if you should be screened.  Ask your health care provider whether you should take a calcium or vitamin D supplement to lower your risk for osteoporosis.  Menopause may have certain physical symptoms and risks.  Hormone replacement therapy may reduce some of these symptoms and risks. Talk to your health care provider about whether hormone replacement therapy is right for you.  HOME CARE INSTRUCTIONS    Schedule regular health, dental, and eye exams.  Stay current with your immunizations.   Do not use any tobacco products including cigarettes, chewing tobacco, or electronic cigarettes.  If you are pregnant, do not drink alcohol.  If you are breastfeeding, limit how much and  how often you drink alcohol.  Limit alcohol intake to no more than 1 drink per day for nonpregnant women. One drink equals 12 ounces of beer, 5 ounces of wine, or 1 ounces of hard liquor.  Do not use street drugs.  Do not share needles.  Ask your health care provider for help if you need support or information about quitting drugs.  Tell your health care provider if you often feel depressed.  Tell your health care provider if you have ever been abused or do not feel safe at home. Document Released: 04/26/2011 Document Revised: 02/25/2014 Document Reviewed: 09/12/2013 [facility] Patient Information 2015 [place], [place]. This information is not intended to replace advice given to you by your health care provider. Make sure you discuss any questions you have with your health care provider.

## 2014-12-12 NOTE — Progress Notes (Signed)
Patient ID: Jill Mays, female   DOB: 01-03-1971, 44 y.o.   MRN: [id number]   Subjective:     Jill Mays is a 44 y.o. female and is here for a comprehensive physical exam. The patient reports no problems.   Returned from [employer] in June,  Had an episode  of chickengunya lasting 3 weeks,  Severe,  No fevers . Son also had the illness  Both are now recovered,     History   Social History  . Marital Status: Divorced    Spouse Name: N/A  . Number of Children: N/A  . Years of Education: N/A   Occupational History  . Not on file.   Social History Main Topics  . Smoking status: Never Smoker   . Smokeless tobacco: Never Used  . Alcohol Use: Yes  . Drug Use: No  . Sexual Activity: Not on file   Other Topics Concern  . Not on file   Social History Narrative   Health Maintenance  Topic Date Due  . PAP SMEAR  08/27/2012  . INFLUENZA VACCINE  05/26/2015  . TETANUS/TDAP  10/26/2020    The following portions of the patient's history were reviewed and updated as appropriate: allergies, current medications, past family history, past medical history, past social history, past surgical history and problem list.  Review of Systems A comprehensive review of systems was negative.   Objective:  BP 98/58 mmHg  Pulse 69  Temp(Src) 98 F (36.7 C) (Oral)  Resp 14  Ht '5\' 3"'  (1.6 m)  Wt 109 lb (49.442 kg)  BMI 19.31 kg/m2  SpO2 98%  General appearance: alert, cooperative and appears stated age Head: Normocephalic, without obvious abnormality, atraumatic Eyes: conjunctivae/corneas clear. PERRL, EOM's intact. Fundi benign. Ears: normal TM's and external ear canals both ears Nose: Nares normal. Septum midline. Mucosa normal. No drainage or sinus tenderness. Throat: lips, mucosa, and tongue normal; teeth and gums normal Neck: no adenopathy, no carotid bruit, no JVD, supple, symmetrical, trachea midline and thyroid not enlarged, symmetric, no tenderness/mass/nodules Lungs: clear  to auscultation bilaterally Breasts: bilateral mastectomy with reconstruction  no masses or tenderness Heart: regular rate and rhythm, S1, S2 normal, no murmur, click, rub or gallop Abdomen: soft, non-tender; bowel sounds normal; no masses,  no organomegaly Extremities: extremities normal, atraumatic, no cyanosis or edema Pulses: 2+ and symmetric Skin: Skin color, texture, turgor normal. No rashes or lesions Neurologic: Alert and oriented X 3, normal strength and tone. Normal symmetric reflexes. Normal coordination and gait.    Assessment and Plan:    Problem List Items Addressed This Visit    Routine general medical examination at a health care facility    Annual wellness  exam was done as well as a comprehensive physical exam and management of acute and chronic conditions .  During the course of the visit the patient was educated and counseled about appropriate screening and preventive services including :  diabetes screening, lipid analysis with projected  10 year  risk for CAD , nutrition counseling, colorectal cancer screening, and recommended immunizations.  Printed recommendations for health maintenance screenings was given.   Lab Results  Component Value Date   TSH 1.86 10/26/2013   Lab Results  Component Value Date   CHOL 192 10/26/2013   HDL 56.60 10/26/2013   LDLCALC 116* 10/26/2013   TRIG 95.0 10/26/2013   CHOLHDL 3 10/26/2013   Lab Results  Component Value Date   ALT 12 12/12/2014   AST 17 12/12/2014  ALKPHOS 75 12/12/2014   BILITOT 0.3 12/12/2014   Lab Results  Component Value Date   NA 139 10/26/2013   K 3.8 10/26/2013   CL 104 10/26/2013   CO2 28 10/26/2013   Lab Results  Component Value Date   CREATININE 0.9 10/26/2013         Family history of breast cancer in first degree relative    s/p prophylactic bilateral mastectomy TAH/BSO in Feb 2011 after 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         Other Visit Diagnoses    Menopause     -  Primary    Screening for osteoporosis        Relevant Orders    DG Bone Density    Need for hepatitis C screening test        Relevant Orders    Hepatitis C antibody (Completed)    Long-term use of high-risk medication        Relevant Orders    Hepatic function panel (Completed)    Vitamin D deficiency        Relevant Orders    Vit D  25 hydroxy (rtn osteoporosis monitoring) (Completed)

## 2014-12-13 LAB — HEPATITIS C ANTIBODY: HCV AB: NEGATIVE

## 2014-12-17 ENCOUNTER — Encounter: Payer: Self-pay | Admitting: *Deleted

## 2014-12-17 ENCOUNTER — Encounter: Payer: Self-pay | Admitting: Internal Medicine

## 2014-12-17 NOTE — Assessment & Plan Note (Addendum)
s/p prophylactic bilateral mastectomy TAH/BSO in Feb 2011 after 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

## 2014-12-17 NOTE — Assessment & Plan Note (Addendum)
Annual wellness  exam was done as well as a comprehensive physical exam and management of acute and chronic conditions .  During the course of the visit the patient was educated and counseled about appropriate screening and preventive services including :  diabetes screening, lipid analysis with projected  10 year  risk for CAD , nutrition counseling, colorectal cancer screening, and recommended immunizations.  Printed recommendations for health maintenance screenings was given.   Lab Results  Component Value Date   TSH 1.86 10/26/2013   Lab Results  Component Value Date   CHOL 192 10/26/2013   HDL 56.60 10/26/2013   LDLCALC 116* 10/26/2013   TRIG 95.0 10/26/2013   CHOLHDL 3 10/26/2013   Lab Results  Component Value Date   ALT 12 12/12/2014   AST 17 12/12/2014   ALKPHOS 75 12/12/2014   BILITOT 0.3 12/12/2014   Lab Results  Component Value Date   NA 139 10/26/2013   K 3.8 10/26/2013   CL 104 10/26/2013   CO2 28 10/26/2013   Lab Results  Component Value Date   CREATININE 0.9 10/26/2013

## 2015-08-04 ENCOUNTER — Telehealth: Payer: Self-pay

## 2015-08-04 ENCOUNTER — Other Ambulatory Visit: Payer: Self-pay

## 2015-08-04 NOTE — Telephone Encounter (Signed)
Bone Density test, assistance with scheduling.  Would like at [facility] as she lives in [place]

## 2015-08-04 NOTE — Telephone Encounter (Signed)
Please advise refill? 

## 2015-08-05 MED ORDER — ZOLPIDEM TARTRATE 5 MG PO TABS: 5.0000 mg | ORAL_TABLET | Freq: Every day | ORAL | Status: DC

## 2015-08-05 NOTE — Telephone Encounter (Signed)
Refill for 30 days only.  OFFICE VISIT NEEDED prior to any more refills 

## 2015-09-04 LAB — HM DEXA SCAN

## 2015-09-08 ENCOUNTER — Encounter: Payer: Self-pay | Admitting: Surgical

## 2015-11-13 ENCOUNTER — Other Ambulatory Visit: Payer: Self-pay | Admitting: Internal Medicine

## 2015-11-13 NOTE — Telephone Encounter (Signed)
Refill for 30 days only.  OFFICE VISIT NEEDED prior to any more refills 

## 2015-11-13 NOTE — Telephone Encounter (Signed)
Last OV 12/02/14 please advise to refill.

## 2015-12-18 ENCOUNTER — Ambulatory Visit (INDEPENDENT_AMBULATORY_CARE_PROVIDER_SITE_OTHER): Payer: BC Managed Care – PPO | Admitting: Internal Medicine

## 2015-12-18 ENCOUNTER — Encounter: Payer: Self-pay | Admitting: Internal Medicine

## 2015-12-18 VITALS — BP 90/60 | HR 67 | Temp 97.8°F | Resp 12 | Ht 62.5 in | Wt 113.2 lb

## 2015-12-18 DIAGNOSIS — Z90710 Acquired absence of both cervix and uterus: Secondary | ICD-10-CM

## 2015-12-18 DIAGNOSIS — G2581 Restless legs syndrome: Secondary | ICD-10-CM

## 2015-12-18 DIAGNOSIS — E559 Vitamin D deficiency, unspecified: Secondary | ICD-10-CM | POA: Diagnosis not present

## 2015-12-18 DIAGNOSIS — Z9013 Acquired absence of bilateral breasts and nipples: Secondary | ICD-10-CM

## 2015-12-18 DIAGNOSIS — Z114 Encounter for screening for human immunodeficiency virus [HIV]: Secondary | ICD-10-CM

## 2015-12-18 DIAGNOSIS — E785 Hyperlipidemia, unspecified: Secondary | ICD-10-CM

## 2015-12-18 DIAGNOSIS — R5383 Other fatigue: Secondary | ICD-10-CM

## 2015-12-18 DIAGNOSIS — Z0000 Encounter for general adult medical examination without abnormal findings: Secondary | ICD-10-CM

## 2015-12-18 DIAGNOSIS — Z9079 Acquired absence of other genital organ(s): Secondary | ICD-10-CM

## 2015-12-18 DIAGNOSIS — F32A Depression, unspecified: Secondary | ICD-10-CM

## 2015-12-18 DIAGNOSIS — F329 Major depressive disorder, single episode, unspecified: Secondary | ICD-10-CM

## 2015-12-18 DIAGNOSIS — Z90722 Acquired absence of ovaries, bilateral: Secondary | ICD-10-CM

## 2015-12-18 LAB — IBC PANEL
IRON: 119 ug/dL (ref 42–145)
SATURATION RATIOS: 25.7 % (ref 20.0–50.0)
Transferrin: 331 mg/dL (ref 212.0–360.0)

## 2015-12-18 LAB — CBC WITH DIFFERENTIAL/PLATELET
BASOS ABS: 0 10*3/uL (ref 0.0–0.1)
Basophils Relative: 0.6 % (ref 0.0–3.0)
EOS PCT: 2.8 % (ref 0.0–5.0)
Eosinophils Absolute: 0.1 10*3/uL (ref 0.0–0.7)
HCT: 38.6 % (ref 36.0–46.0)
Hemoglobin: 12.9 g/dL (ref 12.0–15.0)
LYMPHS ABS: 2.1 10*3/uL (ref 0.7–4.0)
Lymphocytes Relative: 39.6 % (ref 12.0–46.0)
MCHC: 33.5 g/dL (ref 30.0–36.0)
MCV: 89.5 fl (ref 78.0–100.0)
MONOS PCT: 5.2 % (ref 3.0–12.0)
Monocytes Absolute: 0.3 10*3/uL (ref 0.1–1.0)
NEUTROS ABS: 2.7 10*3/uL (ref 1.4–7.7)
NEUTROS PCT: 51.8 % (ref 43.0–77.0)
PLATELETS: 224 10*3/uL (ref 150.0–400.0)
RBC: 4.32 Mil/uL (ref 3.87–5.11)
RDW: 12.7 % (ref 11.5–15.5)
WBC: 5.2 10*3/uL (ref 4.0–10.5)

## 2015-12-18 LAB — COMPREHENSIVE METABOLIC PANEL
ALBUMIN: 4.4 g/dL (ref 3.5–5.2)
ALK PHOS: 70 U/L (ref 39–117)
ALT: 10 U/L (ref 0–35)
AST: 15 U/L (ref 0–37)
BUN: 14 mg/dL (ref 6–23)
CALCIUM: 9.6 mg/dL (ref 8.4–10.5)
CO2: 31 mEq/L (ref 19–32)
Chloride: 103 mEq/L (ref 96–112)
Creatinine, Ser: 0.9 mg/dL (ref 0.40–1.20)
GFR: 71.58 mL/min (ref >60.00)
Glucose, Bld: 82 mg/dL (ref 70–99)
POTASSIUM: 4.2 meq/L (ref 3.5–5.1)
SODIUM: 140 meq/L (ref 135–145)
TOTAL PROTEIN: 7.4 g/dL (ref 6.0–8.3)
Total Bilirubin: 0.3 mg/dL (ref 0.2–1.2)

## 2015-12-18 LAB — LIPID PANEL
Cholesterol: 210 mg/dL — ABNORMAL HIGH (ref 0–200)
HDL: 64.4 mg/dL (ref >39.00)
LDL Cholesterol: 122 mg/dL — ABNORMAL HIGH (ref 0–99)
NONHDL: 145.73
Total CHOL/HDL Ratio: 3
Triglycerides: 117 mg/dL (ref 0.0–149.0)
VLDL: 23.4 mg/dL (ref 0.0–40.0)

## 2015-12-18 LAB — FERRITIN: FERRITIN: 22.1 ng/mL (ref 10.0–291.0)

## 2015-12-18 LAB — VITAMIN D 25 HYDROXY (VIT D DEFICIENCY, FRACTURES): VITD: 39.89 ng/mL (ref 30.00–100.00)

## 2015-12-18 LAB — HIV ANTIBODY (ROUTINE TESTING W REFLEX): HIV 1&2 Ab, 4th Generation: NONREACTIVE

## 2015-12-18 LAB — TSH: TSH: 1.58 u[IU]/mL (ref 0.35–4.50)

## 2015-12-18 NOTE — Progress Notes (Signed)
Pre-visit discussion using our clinic review tool. No additional management support is needed unless otherwise documented below in the visit note.  

## 2015-12-18 NOTE — Patient Instructions (Addendum)
You have osteopenia by recent DEXA ,  because your T scores were -2.0 .   Your  risk of fracture in the next  10 yrs is about 5% .  I recommend continuing daily weight bearing exercise, getting 1200 to 1800 mg mg calcium through diet /supplements ,  1000 units Vit D daily  And a Repeat DEXA in 2 years .  I recommend getting the majority of your calcium and Vitamin D  through dietary sources rather than supplements given the recent association of calcium supplements with increased coronary artery calcium scores    almond/coconut milk , soy milk,  and cashew milk are great nondairy alternatives to dairy sources and are  low calorie,  low carb, and cholesterol free .   Restless Legs Syndrome Restless legs syndrome is a condition that causes uncomfortable feelings or sensations in the legs, especially while sitting or lying down. The sensations usually cause an overwhelming urge to move the legs. The arms can also sometimes be affected. The condition can range from mild to severe. The symptoms often interfere with a person's ability to sleep. CAUSES The cause of this condition is not known. RISK FACTORS This condition is more likely to develop in:  People who are older than age 10.  Pregnant women. In general, restless legs syndrome is more common in women than in men.  People who have a family history of the condition.  People who have certain medical conditions, such as iron deficiency, kidney disease, Parkinson disease, or nerve damage.  People who take certain medicines, such as medicines for high blood pressure, nausea, colds, allergies, depression, and some heart conditions. SYMPTOMS The main symptom of this condition is uncomfortable sensations in the legs. These sensations may be:  Described as pulling, tingling, prickling, throbbing, crawling, or burning.  Worse while you are sitting or lying down.  Worse during periods of rest or inactivity.  Worse at night, often interfering  with your sleep.  Accompanied by a very strong urge to move your legs.  Temporarily relieved by movement of your legs. The sensations usually affect both sides of the body. The arms can also be affected, but this is rare. People who have this condition often have tiredness during the day because of their lack of sleep at night. DIAGNOSIS This condition may be diagnosed based on your description of the symptoms. You may also have tests, including blood tests, to check for other conditions that may lead to your symptoms. In some cases, you may be asked to spend some time in a sleep lab so your sleeping can be monitored. TREATMENT Treatment for this condition is focused on managing the symptoms. Treatment may include:  Self-help and lifestyle changes.  Medicines. HOME CARE INSTRUCTIONS  Take medicines only as directed by your health care provider.  Try these methods to get temporary relief from the uncomfortable sensations:  Massage your legs.  Walk or stretch.  Take a cold or hot bath.  Practice good sleep habits. For example, go to bed and get up at the same time every day.  Exercise regularly.  Practice ways of relaxing, such as yoga or meditation.  Avoid caffeine and alcohol.  Do not use any tobacco products, including cigarettes, chewing tobacco, or electronic cigarettes. If you need help quitting, ask your health care provider.  Keep all follow-up visits as directed by your health care provider. This is important. SEEK MEDICAL CARE IF: Your symptoms do not improve with treatment, or they get worse.  This information is not intended to replace advice given to you by your health care provider. Make sure you discuss any questions you have with your health care provider.   Document Released: 10/01/2002 Document Revised: 02/25/2015 Document Reviewed: 10/07/2014 Elsevier Interactive Patient Education [employer].

## 2015-12-18 NOTE — Progress Notes (Signed)
Patient ID: Jill Mays, female    DOB: 1971-05-25  Age: 45 y.o. MRN: [id number]  The patient is here for annual  wellness examination and management of other chronic and acute problems.  S/p TAH/BSO/bilat mastectomy for BRCA 2012    The risk factors are reflected in the social history.  The roster of all physicians providing medical care to patient - is listed in the Snapshot section of the chart.  Activities of daily living:  The patient is 100% independent in all ADLs: dressing, toileting, feeding as well as independent mobility  Home safety : The patient has smoke detectors in the home. They wear seatbelts.  There are no firearms at home. There is no violence in the home.   There is no risks for hepatitis, STDs or HIV. There is no   history of blood transfusion. They have no travel history to infectious disease endemic areas of the world.  The patient has seen their dentist in the last six month. They have seen their eye doctor in the last year. They admit to slight hearing difficulty with regard to whispered voices and some television programs.  They have deferred audiologic testing in the last year.  They do not  have excessive sun exposure. Discussed the need for sun protection: hats, long sleeves and use of sunscreen if there is significant sun exposure.   Diet: the importance of a healthy diet is discussed. They do have a healthy diet.  The benefits of regular aerobic exercise were discussed. She walks 4 times per week ,  20 minutes.   Depression screen: there are no signs or vegative symptoms of depression- irritability, change in appetite, anhedonia, sadness/tearfullness.  Cognitive assessment: the patient manages all their financial and personal affairs and is actively engaged. They could relate day,date,year and events; recalled 2/3 objects at 3 minutes; performed clock-face test normally.  The following portions of the patient's history were reviewed and updated as  appropriate: allergies, current medications, past family history, past medical history,  past surgical history, past social history  and problem list.  Visual acuity was not assessed per patient preference since she has regular follow up with her ophthalmologist. Hearing and body mass index were assessed and reviewed.   During the course of the visit the patient was educated and counseled about appropriate screening and preventive services including : fall prevention , diabetes screening, nutrition counseling, colorectal cancer screening, and recommended immunizations.    CC: The primary encounter diagnosis was S/P bilateral mastectomy. Diagnoses of Restless legs, Other fatigue, Hyperlipidemia, Vitamin D deficiency, Screening for HIV (human immunodeficiency virus), S/P TAH-BSO (total abdominal hysterectomy and bilateral salpingo-oophorectomy), Encounter for preventive health examination, and Depression were also pertinent to this visit.  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   She has past surgical history that includes Abdominal hysterectomy; Breast surgery; and Bilateral oophorectomy.   Her family history includes Alcohol abuse in her father; Cancer in her maternal grandmother; Cancer (age of onset: 34) in her mother; Cancer (age of onset: 29) in her maternal aunt; Heart disease (age of onset: 28) in her father; Hypertension in her father.She reports that she has never smoked. She has never used smokeless tobacco. She reports that she drinks alcohol. She reports that she does not use illicit drugs.  Outpatient Prescriptions Prior to Visit  Medication Sig Dispense Refill  .  Ascorbic Acid (VITAMIN C) 100 MG tablet Take 500 mg by mouth daily.     . Calcium Carbonate-Vitamin D (CALCIUM + D) 600-200 MG-UNIT TABS  Take by mouth.      . EPINEPHrine (EPIPEN) 0.3 mg/0.3 mL DEVI Inject 0.3 mLs (0.3 mg total) into the muscle once. 1 Device 1  . Multiple Vitamin (MULTIVITAMIN) tablet Take 1 tablet by mouth daily.      Marland Kitchen PREMARIN 0.625 MG tablet TAKE ONE TABLET DAILY 30 tablet 0  . zolpidem (AMBIEN) 5 MG tablet Take 1 tablet (5 mg total) by mouth at bedtime. 30 tablet 0  . FLUoxetine (PROZAC) 20 MG capsule TAKE 1 CAPSULE  BY MOUTH DAILY. 9030 capsule 0   No facility-administered medications prior to visit.    Review of Systems   Patient denies headache, fevers, malaise, unintentional weight loss, skin rash, eye pain, sinus congestion and sinus pain, sore throat, dysphagia,  hemoptysis , cough, dyspnea, wheezing, chest pain, palpitations, orthopnea, edema, abdominal pain, nausea, melena, diarrhea, constipation, flank pain, dysuria, hematuria, urinary  Frequency, nocturia, numbness, tingling, seizures,  Focal weakness, Loss of consciousness,  Tremor, insomnia, depression, anxiety, and suicidal ideation.     Objective:  BP 90/60 mmHg  Pulse 67  Temp(Src) 97.8 F (36.6 C) (Oral)  Resp 12  Ht 5' 2.5" (1.588 m)  Wt 113 lb 4 oz (51.37 kg)  BMI 20.37 kg/m2  SpO2 99%  Physical Exam   .General appearance: alert, cooperative and appears stated age Head: Normocephalic, without obvious abnormality, atraumatic Eyes: conjunctivae/corneas clear. PERRL, EOM's intact. Fundi benign. Ears: normal TM's and external ear canals both ears Nose: Nares normal. Septum midline. Mucosa normal. No drainage or sinus tenderness. Throat: lips, mucosa, and tongue normal; teeth and gums normal Neck: no adenopathy, no carotid bruit, no JVD, supple, symmetrical, trachea midline and thyroid not enlarged, symmetric, no tenderness/mass/nodules Lungs: clear to auscultation bilaterally Breasts: bilateral mastectomy with implants.  Heart: regular rate and rhythm, S1, S2 normal, no murmur, click, rub or gallop Abdomen: soft, non-tender;  bowel sounds normal; no masses,  no organomegaly Extremities: extremities normal, atraumatic, no cyanosis or edema Pulses: 2+ and symmetric Skin: Skin color, texture, turgor normal. No rashes or lesions Neurologic: Alert and oriented X 3, normal strength and tone. Normal symmetric reflexes. Normal coordination and gait.     Assessment & Plan:   Problem List Items Addressed This Visit    Depression    Symptoms are well controlled on current medications. Continue generic prozac  And prn ambien for insomnia.Marland Kitchenttdepr      Relevant Medications   FLUoxetine (PROZAC) 20 MG capsule   Encounter for preventive health examination    Annual comprehensive preventive exam was done as well as an evaluation and management of chronic conditions .  During the course of the visit the patient was educated and counseled about appropriate screening and preventive services including :  diabetes screening, lipid analysis with projected  10 year  risk for CAD , nutrition counseling,  colorectal cancer screening, and recommended immunizations.  Printed recommendations for health maintenance screenings was given      S/P bilateral mastectomy - Primary   S/P TAH-BSO (total abdominal hysterectomy and bilateral salpingo-oophorectomy)    Other Visit Diagnoses    Restless legs        Relevant Orders    IBC panel (Completed)    CBC with Differential/Platelet (Completed)    Ferritin (Completed)    Other fatigue        Relevant  Orders    Comprehensive metabolic panel (Completed)    Hyperlipidemia        Relevant Orders    Hepatitis C antibody (Completed)    TSH (Completed)    Lipid panel (Completed)    Vitamin D deficiency        Relevant Orders    VITAMIN D 25 Hydroxy (Vit-D Deficiency, Fractures) (Completed)    Screening for HIV (human immunodeficiency virus)        Relevant Orders    HIV antibody (Completed)       I am having Ms. Alewine maintain her multivitamin, vitamin C, Calcium Carbonate-Vitamin D,  EPINEPHrine, zolpidem, PREMARIN, and FLUoxetine.  Meds ordered this encounter  Medications  . FLUoxetine (PROZAC) 20 MG capsule    Sig: TAKE 1 CAPSULE  BY MOUTH DAILY.    Dispense:  90 capsule    Refill:  3    Medications Discontinued During This Encounter  Medication Reason  . FLUoxetine (PROZAC) 20 MG capsule Reorder    Follow-up: No Follow-up on file.   Crecencio Mc, MD

## 2015-12-19 ENCOUNTER — Encounter: Payer: Self-pay | Admitting: Internal Medicine

## 2015-12-19 LAB — HEPATITIS C ANTIBODY: HCV Ab: NEGATIVE

## 2015-12-20 ENCOUNTER — Encounter: Payer: Self-pay | Admitting: Internal Medicine

## 2015-12-20 MED ORDER — FLUOXETINE HCL 20 MG PO CAPS: ORAL_CAPSULE | ORAL | Status: DC

## 2015-12-20 NOTE — Assessment & Plan Note (Signed)
Symptoms are well controlled on current medications. Continue generic prozac  And prn ambien for insomnia..ttdepr 

## 2015-12-20 NOTE — Assessment & Plan Note (Signed)
Annual comprehensive preventive exam was done as well as an evaluation and management of chronic conditions .  During the course of the visit the patient was educated and counseled about appropriate screening and preventive services including :  diabetes screening, lipid analysis with projected  10 year  risk for CAD , nutrition counseling, colorectal cancer screening, and recommended immunizations.  Printed recommendations for health maintenance screenings was given.   

## 2016-01-06 NOTE — Telephone Encounter (Signed)
Mailed unread message to patient.  

## 2016-03-11 ENCOUNTER — Telehealth: Payer: Self-pay | Admitting: Internal Medicine

## 2016-03-11 MED ORDER — ESTROGENS CONJUGATED 0.625 MG PO TABS: 0.6250 mg | ORAL_TABLET | Freq: Every day | ORAL | Status: DC

## 2016-03-11 NOTE — Telephone Encounter (Signed)
Pt needs a refill on PREMARIN 0.625 MG tablet sent to  [facility]- [address] [place], [place] [address].Marland Kitchen Please advise

## 2016-03-11 NOTE — Telephone Encounter (Signed)
Refilled, thanks

## 2016-05-07 ENCOUNTER — Telehealth: Payer: Self-pay | Admitting: *Deleted

## 2016-05-07 MED ORDER — FLUOXETINE HCL 20 MG PO CAPS: ORAL_CAPSULE | ORAL | Status: DC

## 2016-05-07 MED ORDER — ESTROGENS CONJUGATED 0.625 MG PO TABS: 0.6250 mg | ORAL_TABLET | Freq: Every day | ORAL | Status: DC

## 2016-05-07 NOTE — Telephone Encounter (Signed)
rx refilled. thanks

## 2016-05-07 NOTE — Telephone Encounter (Signed)
[facility] premarin and fluoxetine-90 day supply  Pharmacy [facility]

## 2016-08-24 ENCOUNTER — Telehealth: Payer: Self-pay | Admitting: Internal Medicine

## 2016-08-24 DIAGNOSIS — E8940 Asymptomatic postprocedural ovarian failure: Secondary | ICD-10-CM

## 2016-08-24 MED ORDER — ESTROGENS CONJUGATED 0.625 MG PO TABS: 0.6250 mg | ORAL_TABLET | Freq: Every day | ORAL | 3 refills | Status: DC

## 2016-08-24 NOTE — Telephone Encounter (Signed)
Patient requested refill on premarin not Epi pen new script sent to pharmacy.

## 2016-08-24 NOTE — Telephone Encounter (Signed)
Pt called about her EPINEPHrine (EPIPEN) 0.3 mg/0.3 mL DEVI.  She takes 1 pill a day. Pt is requesting 21 pills because she is short. After that she will need a 90 day supply on her next refill.

## 2017-01-20 ENCOUNTER — Encounter: Payer: BC Managed Care – PPO | Admitting: Internal Medicine

## 2017-02-24 ENCOUNTER — Ambulatory Visit (INDEPENDENT_AMBULATORY_CARE_PROVIDER_SITE_OTHER): Payer: BC Managed Care – PPO | Admitting: Internal Medicine

## 2017-02-24 ENCOUNTER — Encounter: Payer: Self-pay | Admitting: Internal Medicine

## 2017-02-24 VITALS — BP 100/64 | HR 63 | Temp 97.7°F | Resp 15 | Ht 62.5 in | Wt 113.2 lb

## 2017-02-24 DIAGNOSIS — Z9013 Acquired absence of bilateral breasts and nipples: Secondary | ICD-10-CM | POA: Diagnosis not present

## 2017-02-24 DIAGNOSIS — F329 Major depressive disorder, single episode, unspecified: Secondary | ICD-10-CM

## 2017-02-24 DIAGNOSIS — Z0000 Encounter for general adult medical examination without abnormal findings: Secondary | ICD-10-CM

## 2017-02-24 DIAGNOSIS — Z803 Family history of malignant neoplasm of breast: Secondary | ICD-10-CM

## 2017-02-24 DIAGNOSIS — G2581 Restless legs syndrome: Secondary | ICD-10-CM | POA: Diagnosis not present

## 2017-02-24 LAB — COMPREHENSIVE METABOLIC PANEL
ALT: 13 U/L (ref 0–35)
AST: 17 U/L (ref 0–37)
Albumin: 4.2 g/dL (ref 3.5–5.2)
Alkaline Phosphatase: 72 U/L (ref 39–117)
BUN: 17 mg/dL (ref 6–23)
CHLORIDE: 105 meq/L (ref 96–112)
CO2: 28 meq/L (ref 19–32)
CREATININE: 1.01 mg/dL (ref 0.40–1.20)
Calcium: 9.8 mg/dL (ref 8.4–10.5)
GFR: 62.34 mL/min (ref >60.00)
Glucose, Bld: 81 mg/dL (ref 70–99)
Potassium: 4.3 mEq/L (ref 3.5–5.1)
SODIUM: 140 meq/L (ref 135–145)
Total Bilirubin: 0.5 mg/dL (ref 0.2–1.2)
Total Protein: 7.1 g/dL (ref 6.0–8.3)

## 2017-02-24 LAB — CBC WITH DIFFERENTIAL/PLATELET
BASOS PCT: 0.9 % (ref 0.0–3.0)
Basophils Absolute: 0 10*3/uL (ref 0.0–0.1)
EOS ABS: 0.2 10*3/uL (ref 0.0–0.7)
Eosinophils Relative: 4 % (ref 0.0–5.0)
HCT: 40.2 % (ref 36.0–46.0)
Hemoglobin: 13.5 g/dL (ref 12.0–15.0)
LYMPHS ABS: 2.3 10*3/uL (ref 0.7–4.0)
Lymphocytes Relative: 43.4 % (ref 12.0–46.0)
MCHC: 33.5 g/dL (ref 30.0–36.0)
MCV: 89.5 fl (ref 78.0–100.0)
MONO ABS: 0.4 10*3/uL (ref 0.1–1.0)
Monocytes Relative: 7.6 % (ref 3.0–12.0)
NEUTROS ABS: 2.3 10*3/uL (ref 1.4–7.7)
Neutrophils Relative %: 44.1 % (ref 43.0–77.0)
PLATELETS: 266 10*3/uL (ref 150.0–400.0)
RBC: 4.49 Mil/uL (ref 3.87–5.11)
RDW: 12.9 % (ref 11.5–15.5)
WBC: 5.3 10*3/uL (ref 4.0–10.5)

## 2017-02-24 LAB — IBC PANEL
IRON: 207 ug/dL — AB (ref 42–145)
Saturation Ratios: 49.8 % (ref 20.0–50.0)
TRANSFERRIN: 297 mg/dL (ref 212.0–360.0)

## 2017-02-24 LAB — FERRITIN: Ferritin: 46.3 ng/mL (ref 10.0–291.0)

## 2017-02-24 NOTE — Patient Instructions (Addendum)
TRY MAGNESIUM  SUPPLEMENT 800  MG  FOR RESTLESS  LEGS  I Will refill your prozac at 20 mg  For another year    recommend getting the majority of your calcium and Vitamin D  through diet rather than supplements given the recent association of calcium supplements with increased coronary artery calcium scores  Try the almond and cashew milks that most grocery stores  now carry  in the dairy  Section>   They are lactose free:  [employer] brand Almond Light,  Original formula.  Delicious,  Low carb,  Low cal,  Cholesterol free    Health Maintenance for Postmenopausal Women Menopause is a normal process in which your reproductive ability comes to an end. This process happens gradually over a span of months to years, usually between the ages of 15 and 48. Menopause is complete when you have missed 12 consecutive menstrual periods. It is important to talk with your health care provider about some of the most common conditions that affect postmenopausal women, such as heart disease, cancer, and bone loss (osteoporosis). Adopting a healthy lifestyle and getting preventive care can help to promote your health and wellness. Those actions can also lower your chances of developing some of these common conditions. What should I know about menopause? During menopause, you may experience a number of symptoms, such as:  Moderate-to-severe hot flashes.  Night sweats.  Decrease in sex drive.  Mood swings.  Headaches.  Tiredness.  Irritability.  Memory problems.  Insomnia. Choosing to treat or not to treat menopausal changes is an individual decision that you make with your health care provider. What should I know about hormone replacement therapy and supplements? Hormone therapy products are effective for treating symptoms that are associated with menopause, such as hot flashes and night sweats. Hormone replacement carries certain risks, especially as you become older. If you are thinking about using  estrogen or estrogen with progestin treatments, discuss the benefits and risks with your health care provider. What should I know about heart disease and stroke? Heart disease, heart attack, and stroke become more likely as you age. This may be due, in part, to the hormonal changes that your body experiences during menopause. These can affect how your body processes dietary fats, triglycerides, and cholesterol. Heart attack and stroke are both medical emergencies. There are many things that you can do to help prevent heart disease and stroke:  Have your blood pressure checked at least every 1-2 years. High blood pressure causes heart disease and increases the risk of stroke.  If you are 48 years old, ask your health care provider if you should take aspirin to prevent a heart attack or a stroke.  Do not use any tobacco products, including cigarettes, chewing tobacco, or electronic cigarettes. If you need help quitting, ask your health care provider.  It is important to eat a healthy diet and maintain a healthy weight.  Be sure to include plenty of vegetables, fruits, low-fat dairy products, and lean protein.  Avoid eating foods that are high in solid fats, added sugars, or salt (sodium).  Get regular exercise. This is one of the most important things that you can do for your health.  Try to exercise for at least 150 minutes each week. The type of exercise that you do should increase your heart rate and make you sweat. This is known as moderate-intensity exercise.  Try to do strengthening exercises at least twice each week. Do these in addition to the moderate-intensity exercise.  Know your numbers.Ask your health care provider to check your cholesterol and your blood glucose. Continue to have your blood tested as directed by your health care provider. What should I know about cancer screening? There are several types of cancer. Take the following steps to reduce your risk and to catch any  cancer development as early as possible. Breast Cancer  Practice breast self-awareness.  This means understanding how your breasts normally appear and feel.  It also means doing regular breast self-exams. Let your health care provider know about any changes, no matter how small.  If you are 48 or older, have a clinician do a breast exam (clinical breast exam or CBE) every year. Depending on your age, family history, and medical history, it may be recommended that you also have a yearly breast X-ray (mammogram).  If you have a family history of breast cancer, talk with your health care provider about genetic screening.  If you are at high risk for breast cancer, talk with your health care provider about having an MRI and a mammogram every year.  Breast cancer (BRCA) gene test is recommended for women who have family members with BRCA-related cancers. Results of the assessment will determine the need for genetic counseling and BRCA1 and for BRCA2 testing. BRCA-related cancers include these types:  Breast. This occurs in males or females.  Ovarian.  Tubal. This may also be called fallopian tube cancer.  Cancer of the abdominal or pelvic lining (peritoneal cancer).  Prostate.  Pancreatic. Cervical, Uterine, and Ovarian Cancer  Your health care provider may recommend that you be screened regularly for cancer of the pelvic organs. These include your ovaries, uterus, and vagina. This screening involves a pelvic exam, which includes checking for microscopic changes to the surface of your cervix (Pap test).  For women ages 21-65, health care providers may recommend a pelvic exam and a Pap test every three years. For women ages 26-65, they may recommend the Pap test and pelvic exam, combined with testing for human papilloma virus (HPV), every five years. Some types of HPV increase your risk of cervical cancer. Testing for HPV may also be done on women of any age who have unclear Pap test  results.  Other health care providers may not recommend any screening for nonpregnant women who are considered low risk for pelvic cancer and have no symptoms. Ask your health care provider if a screening pelvic exam is right for you.  If you have had past treatment for cervical cancer or a condition that could lead to cancer, you need Pap tests and screening for cancer for at least 20 years after your treatment. If Pap tests have been discontinued for you, your risk factors (such as having a new sexual partner) need to be reassessed to determine if you should start having screenings again. Some women have medical problems that increase the chance of getting cervical cancer. In these cases, your health care provider may recommend that you have screening and Pap tests more often.  If you have a family history of uterine cancer or ovarian cancer, talk with your health care provider about genetic screening.  If you have vaginal bleeding after reaching menopause, tell your health care provider.  There are currently no reliable tests available to screen for ovarian cancer. Lung Cancer  Lung cancer screening is recommended for adults 63-60 years old who are at high risk for lung cancer because of a history of smoking. A yearly low-dose CT scan of the lungs  is recommended if you:  Currently smoke.  Have a history of at least 30 pack-years of smoking and you currently smoke or have quit within the past 15 years. A pack-year is smoking an average of one pack of cigarettes per day for one year. Yearly screening should:  Continue until it has been 15 years since you quit.  Stop if you develop a health problem that would prevent you from having lung cancer treatment. Colorectal Cancer  This type of cancer can be detected and can often be prevented.  Routine colorectal cancer screening usually begins at age 64 and continues through age 28.  If you have risk factors for colon cancer, your health care  provider may recommend that you be screened at an earlier age.  If you have a family history of colorectal cancer, talk with your health care provider about genetic screening.  Your health care provider may also recommend using home test kits to check for hidden blood in your stool.  A small camera at the end of a tube can be used to examine your colon directly (sigmoidoscopy or colonoscopy). This is done to check for the earliest forms of colorectal cancer.  Direct examination of the colon should be repeated every 5-10 years until age 65. However, if early forms of precancerous polyps or small growths are found or if you have a family history or genetic risk for colorectal cancer, you may need to be screened more often. Skin Cancer  Check your skin from head to toe regularly.  Monitor any moles. Be sure to tell your health care provider:  About any new moles or changes in moles, especially if there is a change in a mole's shape or color.  If you have a mole that is larger than the size of a pencil eraser.  If any of your family members has a history of skin cancer, especially at a young age, talk with your health care provider about genetic screening.  Always use sunscreen. Apply sunscreen liberally and repeatedly throughout the day.  Whenever you are outside, protect yourself by wearing long sleeves, pants, a wide-brimmed hat, and sunglasses. What should I know about osteoporosis? Osteoporosis is a condition in which bone destruction happens more quickly than new bone creation. After menopause, you may be at an increased risk for osteoporosis. To help prevent osteoporosis or the bone fractures that can happen because of osteoporosis, the following is recommended:  If you are 73-19 years old, get at least 1,000 mg of calcium and at least 600 mg of vitamin D per day.  If you are older than age 63 but younger than age 4, get at least 1,200 mg of calcium and at least 600 mg of vitamin D  per day.  If you are older than age 2, get at least 1,200 mg of calcium and at least 800 mg of vitamin D per day. Smoking and excessive alcohol intake increase the risk of osteoporosis. Eat foods that are rich in calcium and vitamin D, and do weight-bearing exercises several times each week as directed by your health care provider. What should I know about how menopause affects my mental health? Depression may occur at any age, but it is more common as you become older. Common symptoms of depression include:  Low or sad mood.  Changes in sleep patterns.  Changes in appetite or eating patterns.  Feeling an overall lack of motivation or enjoyment of activities that you previously enjoyed.  Frequent crying spells. Talk  with your health care provider if you think that you are experiencing depression. What should I know about immunizations? It is important that you get and maintain your immunizations. These include:  Tetanus, diphtheria, and pertussis (Tdap) booster vaccine.  Influenza every year before the flu season begins.  Pneumonia vaccine.  Shingles vaccine. Your health care provider may also recommend other immunizations. This information is not intended to replace advice given to you by your health care provider. Make sure you discuss any questions you have with your health care provider. Document Released: 12/03/2005 Document Revised: 04/30/2016 Document Reviewed: 07/15/2015 Elsevier Interactive Patient Education  2017 [employer].

## 2017-02-24 NOTE — Progress Notes (Signed)
Patient ID: Jill Mays, female    DOB: 1971/08/11  Age: 46 y.o. MRN: [id number]  The patient is here for annual preventive examination and management of other chronic and acute problems.    s/p  PROPHYLACTIC bilateral mastectomy,  TAH/BSO for FAMILY history of breast cancer,  brca 1 POSITIVE  in 2011   DEXA DONE 2016 : OSTEOPENIA NOTED   FEELINGS OF RESTLESS LEGS DID IMPROVE SOMEWHAT WITH IRON SUPPLEMENTATION.  HAS BEEN TAKING IT INTERMITTTENLTY FOR THE LAST SEVEREAL MONTHS    The risk factors are reflected in the social history.  The roster of all physicians providing medical care to patient - is listed in the Snapshot section of the chart.  Activities of daily living:  The patient is 100% independent in all ADLs: dressing, toileting, feeding as well as independent mobility  Home safety : The patient has smoke detectors in the home. They wear seatbelts.  There are no firearms at home. There is no violence in the home.   There is no risks for hepatitis, STDs or HIV. There is no   history of blood transfusion. They have no travel history to infectious disease endemic areas of the world.  The patient has seen their dentist in the last six month. They have seen their eye doctor in the last year.    They do not  have excessive sun exposure. Discussed the need for sun protection: hats, long sleeves and use of sunscreen if there is significant sun exposure.   Diet: the importance of a healthy diet is discussed. They do have a healthy diet.  The benefits of regular aerobic exercise were discussed. She walks 4 times per week ,  20 minutes.   Depression screen: there are no signs or vegative symptoms of depression- irritability, change in appetite, anhedonia, sadness/tearfullness.  The following portions of the patient's history were reviewed and updated as appropriate: allergies, current medications, past family history, past medical history,  past surgical history, past social history   and problem list.  Visual acuity was not assessed per patient preference since she has regular follow up with her ophthalmologist. Hearing and body mass index were assessed and reviewed.   During the course of the visit the patient was educated and counseled about appropriate screening and preventive services including : fall prevention , diabetes screening, nutrition counseling, colorectal cancer screening, and recommended immunizations.    CC: The primary encounter diagnosis was Restless legs. Diagnoses of Encounter for preventive health examination, Family history of breast cancer in first degree relative, S/P bilateral mastectomy, and Reactive depression were also pertinent to this visit.  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   She has a past surgical history that includes Abdominal hysterectomy; Breast surgery; and Bilateral oophorectomy.   Her family history includes Alcohol abuse in her father; Cancer in her maternal grandmother; Cancer (age of onset: 39) in her mother; Cancer (age of onset: 72) in her maternal aunt; Heart disease (age of onset: 72) in her father; Hypertension in her father.She reports that she has never smoked. She has never used smokeless tobacco. She reports that she drinks alcohol. She reports that she does not use drugs.  Outpatient Medications Prior to Visit  Medication Sig Dispense Refill  . Ascorbic Acid (VITAMIN C) 100 MG tablet Take 500 mg by mouth 2 (two) times daily.     Marland Kitchen  Calcium Carbonate-Vitamin D (CALCIUM + D) 600-200 MG-UNIT TABS Take by mouth.      . EPINEPHrine (EPIPEN) 0.3 mg/0.3 mL DEVI Inject 0.3 mLs (0.3 mg total) into the muscle once. 1 Device 1  . estrogens, conjugated, (PREMARIN) 0.625 MG tablet Take 1 tablet (0.625 mg total) by mouth daily. 90 tablet 3  .  FLUoxetine (PROZAC) 20 MG capsule TAKE 1 CAPSULE  BY MOUTH DAILY. 90 capsule 3  . Multiple Vitamin (MULTIVITAMIN) tablet Take 1 tablet by mouth daily.      Marland Kitchen zolpidem (AMBIEN) 5 MG tablet Take 1 tablet (5 mg total) by mouth at bedtime. (Patient not taking: Reported on 02/24/2017) 30 tablet 0   No facility-administered medications prior to visit.     Review of Systems   Patient denies headache, fevers, malaise, unintentional weight loss, skin rash, eye pain, sinus congestion and sinus pain, sore throat, dysphagia,  hemoptysis , cough, dyspnea, wheezing, chest pain, palpitations, orthopnea, edema, abdominal pain, nausea, melena, diarrhea, constipation, flank pain, dysuria, hematuria, urinary  Frequency, nocturia, numbness, tingling, seizures,  Focal weakness, Loss of consciousness,  Tremor, insomnia, depression, anxiety, and suicidal ideation.      Objective:  BP 100/64 (BP Location: Left Arm, Patient Position: Sitting, Cuff Size: Normal)   Pulse 63   Temp 97.7 F (36.5 C) (Oral)   Resp 15   Ht 5' 2.5" (1.588 m)   Wt 113 lb 3.2 oz (51.3 kg)   SpO2 98%   BMI 20.37 kg/m   Physical Exam   .General appearance: alert, cooperative and appears stated age Head: Normocephalic, without obvious abnormality, atraumatic Eyes: conjunctivae/corneas clear. PERRL, EOM's intact. Fundi benign. Ears: normal TM's and external ear canals both ears Nose: Nares normal. Septum midline. Mucosa normal. No drainage or sinus tenderness. Throat: lips, mucosa, and tongue normal; teeth and gums normal Neck: no adenopathy, no carotid bruit, no JVD, supple, symmetrical, trachea midline and thyroid not enlarged, symmetric, no tenderness/mass/nodules Lungs: clear to auscultation bilaterally Breasts: bilateral mastectom with saline implants  Heart: regular rate and rhythm, S1, S2 normal, no murmur, click, rub or gallop Abdomen: soft, non-tender; bowel sounds normal; no masses,  no organomegaly Extremities: extremities  normal, atraumatic, no cyanosis or edema Pulses: 2+ and symmetric Skin: Skin color, texture, turgor normal. No rashes or lesions Neurologic: Alert and oriented X 3, normal strength and tone. Normal symmetric reflexes. Normal coordination and gait.      Assessment & Plan:   Problem List Items Addressed This Visit    Depression    Symptoms are well controlled on current medications. Continue generic prozac  And prn ambien for insomnia.Marland Kitchenttdepr      Encounter for preventive health examination    Annual comprehensive preventive exam was done as well as an evaluation and management of chronic conditions .  During the course of the visit the patient was educated and counseled about appropriate screening and preventive services including :  diabetes screening, lipid analysis with projected  10 year  risk for CAD , nutrition counseling, breast, cervical and colorectal cancer screening, and recommended immunizations.  Printed recommendations for health maintenance screenings was given      Family history of breast cancer in first degree relative    Managed with prophylactic bilateral mastectomy TAH/BSO in Feb 2011 after testing positive for BRCA 1 gene mutation      S/P bilateral mastectomy    With plastic surgery /reconstruction done in 2011 .       Other Visit Diagnoses  Restless legs    -  Primary   Relevant Orders   Comprehensive metabolic panel (Completed)   CBC with Differential/Platelet (Completed)   IBC panel (Completed)   Ferritin (Completed)      I have discontinued Ms. Gacek's zolpidem. I am also having her maintain her multivitamin, vitamin C, Calcium Carbonate-Vitamin D, EPINEPHrine, FLUoxetine, estrogens (conjugated), doxylamine (Sleep), and ferrous sulfate.  Meds ordered this encounter  Medications  . doxylamine, Sleep, (UNISOM) 25 MG tablet    Sig: Take 25 mg by mouth at bedtime as needed.  . ferrous sulfate 325 (65 FE) MG tablet    Sig: Take 325 mg by mouth daily  with breakfast.    Medications Discontinued During This Encounter  Medication Reason  . zolpidem (AMBIEN) 5 MG tablet Patient has not taken in last 30 days    Follow-up: No Follow-up on file.   Crecencio Mc, MD

## 2017-02-24 NOTE — Progress Notes (Signed)
Pre visit review using our clinic review tool, if applicable. No additional management support is needed unless otherwise documented below in the visit note. 

## 2017-02-26 NOTE — Assessment & Plan Note (Addendum)
With plastic surgery /reconstruction done in 2011 .

## 2017-02-26 NOTE — Assessment & Plan Note (Signed)
Annual comprehensive preventive exam was done as well as an evaluation and management of chronic conditions .  During the course of the visit the patient was educated and counseled about appropriate screening and preventive services including :  diabetes screening, lipid analysis with projected  10 year  risk for CAD , nutrition counseling, breast, cervical and colorectal cancer screening, and recommended immunizations.  Printed recommendations for health maintenance screenings was given 

## 2017-02-26 NOTE — Assessment & Plan Note (Signed)
Symptoms are well controlled on current medications. Continue generic prozac  And prn ambien for insomnia.Marland Kitchenttdepr

## 2017-02-26 NOTE — Assessment & Plan Note (Addendum)
Managed with prophylactic bilateral mastectomy TAH/BSO in Feb 2011 after testing positive for BRCA 1 gene mutation

## 2017-02-27 ENCOUNTER — Encounter: Payer: Self-pay | Admitting: Internal Medicine

## 2017-07-06 ENCOUNTER — Other Ambulatory Visit: Payer: Self-pay | Admitting: Internal Medicine

## 2017-10-06 ENCOUNTER — Other Ambulatory Visit: Payer: Self-pay | Admitting: Internal Medicine

## 2017-10-06 DIAGNOSIS — E8940 Asymptomatic postprocedural ovarian failure: Secondary | ICD-10-CM

## 2017-10-21 ENCOUNTER — Encounter: Payer: Self-pay | Admitting: Internal Medicine

## 2018-07-19 ENCOUNTER — Other Ambulatory Visit: Payer: Self-pay | Admitting: Internal Medicine

## 2018-08-31 ENCOUNTER — Ambulatory Visit: Payer: BC Managed Care – PPO | Admitting: Internal Medicine

## 2018-08-31 ENCOUNTER — Encounter: Payer: Self-pay | Admitting: Internal Medicine

## 2018-08-31 VITALS — BP 94/66 | HR 60 | Temp 97.8°F | Resp 14 | Ht 62.5 in | Wt 115.6 lb

## 2018-08-31 DIAGNOSIS — M549 Dorsalgia, unspecified: Secondary | ICD-10-CM

## 2018-08-31 DIAGNOSIS — Z0000 Encounter for general adult medical examination without abnormal findings: Secondary | ICD-10-CM

## 2018-08-31 DIAGNOSIS — G2581 Restless legs syndrome: Secondary | ICD-10-CM

## 2018-08-31 DIAGNOSIS — M546 Pain in thoracic spine: Secondary | ICD-10-CM | POA: Diagnosis not present

## 2018-08-31 DIAGNOSIS — M62830 Muscle spasm of back: Secondary | ICD-10-CM | POA: Diagnosis not present

## 2018-08-31 DIAGNOSIS — Z13220 Encounter for screening for lipoid disorders: Secondary | ICD-10-CM | POA: Diagnosis not present

## 2018-08-31 LAB — CBC WITH DIFFERENTIAL/PLATELET
Basophils Absolute: 0 10*3/uL (ref 0.0–0.1)
Basophils Relative: 0.8 % (ref 0.0–3.0)
EOS PCT: 3 % (ref 0.0–5.0)
Eosinophils Absolute: 0.2 10*3/uL (ref 0.0–0.7)
HCT: 39.6 % (ref 36.0–46.0)
HEMOGLOBIN: 13.4 g/dL (ref 12.0–15.0)
Lymphocytes Relative: 45.1 % (ref 12.0–46.0)
Lymphs Abs: 2.3 10*3/uL (ref 0.7–4.0)
MCHC: 33.9 g/dL (ref 30.0–36.0)
MCV: 90.3 fl (ref 78.0–100.0)
Monocytes Absolute: 0.3 10*3/uL (ref 0.1–1.0)
Monocytes Relative: 6.3 % (ref 3.0–12.0)
Neutro Abs: 2.2 10*3/uL (ref 1.4–7.7)
Neutrophils Relative %: 44.8 % (ref 43.0–77.0)
Platelets: 230 10*3/uL (ref 150.0–400.0)
RBC: 4.38 Mil/uL (ref 3.87–5.11)
RDW: 12.6 % (ref 11.5–15.5)
WBC: 5 10*3/uL (ref 4.0–10.5)

## 2018-08-31 LAB — LIPID PANEL
CHOLESTEROL: 223 mg/dL — AB (ref 0–200)
HDL: 73.9 mg/dL (ref >39.00)
LDL Cholesterol: 117 mg/dL — ABNORMAL HIGH (ref 0–99)
NonHDL: 149.16
TRIGLYCERIDES: 162 mg/dL — AB (ref 0.0–149.0)
Total CHOL/HDL Ratio: 3
VLDL: 32.4 mg/dL (ref 0.0–40.0)

## 2018-08-31 LAB — COMPREHENSIVE METABOLIC PANEL
ALBUMIN: 4.6 g/dL (ref 3.5–5.2)
ALT: 12 U/L (ref 0–35)
AST: 17 U/L (ref 0–37)
Alkaline Phosphatase: 64 U/L (ref 39–117)
BUN: 19 mg/dL (ref 6–23)
CALCIUM: 9.9 mg/dL (ref 8.4–10.5)
CHLORIDE: 101 meq/L (ref 96–112)
CO2: 30 mEq/L (ref 19–32)
Creatinine, Ser: 0.94 mg/dL (ref 0.40–1.20)
GFR: 67.29 mL/min (ref >60.00)
Glucose, Bld: 68 mg/dL — ABNORMAL LOW (ref 70–99)
POTASSIUM: 4 meq/L (ref 3.5–5.1)
SODIUM: 137 meq/L (ref 135–145)
TOTAL PROTEIN: 7.6 g/dL (ref 6.0–8.3)
Total Bilirubin: 0.4 mg/dL (ref 0.2–1.2)

## 2018-08-31 LAB — MAGNESIUM: MAGNESIUM: 1.9 mg/dL (ref 1.5–2.5)

## 2018-08-31 LAB — TSH: TSH: 2.11 u[IU]/mL (ref 0.35–4.50)

## 2018-08-31 MED ORDER — FLUOXETINE HCL 20 MG PO CAPS: 20.0000 mg | ORAL_CAPSULE | Freq: Every day | ORAL | 0 refills | Status: DC

## 2018-08-31 NOTE — Patient Instructions (Signed)
REferral to PT Vicente Serene in progress  If you decide you want to try gabapentin, requip,  Or mirapex.  Let me know  [place] Maintenance for Postmenopausal Women Menopause is a normal process in which your reproductive ability comes to an end. This process happens gradually over a span of months to years, usually between the ages of 83 and 19. Menopause is complete when you have missed 12 consecutive menstrual periods. It is important to talk with your health care provider about some of the most common conditions that affect postmenopausal women, such as heart disease, cancer, and bone loss (osteoporosis). Adopting a healthy lifestyle and getting preventive care can help to promote your health and wellness. Those actions can also lower your chances of developing some of these common conditions. What should I know about menopause? During menopause, you may experience a number of symptoms, such as:  Moderate-to-severe hot flashes.  Night sweats.  Decrease in sex drive.  Mood swings.  Headaches.  Tiredness.  Irritability.  Memory problems.  Insomnia.  Choosing to treat or not to treat menopausal changes is an individual decision that you make with your health care provider. What should I know about hormone replacement therapy and supplements? Hormone therapy products are effective for treating symptoms that are associated with menopause, such as hot flashes and night sweats. Hormone replacement carries certain risks, especially as you become older. If you are thinking about using estrogen or estrogen with progestin treatments, discuss the benefits and risks with your health care provider. What should I know about heart disease and stroke? Heart disease, heart attack, and stroke become more likely as you age. This may be due, in part, to the hormonal changes that your body experiences during menopause. These can affect how your body processes dietary fats, triglycerides, and  cholesterol. Heart attack and stroke are both medical emergencies. There are many things that you can do to help prevent heart disease and stroke:  Have your blood pressure checked at least every 1-2 years. High blood pressure causes heart disease and increases the risk of stroke.  If you are 91-69 years old, ask your health care provider if you should take aspirin to prevent a heart attack or a stroke.  Do not use any tobacco products, including cigarettes, chewing tobacco, or electronic cigarettes. If you need help quitting, ask your health care provider.  It is important to eat a healthy diet and maintain a healthy weight. ? Be sure to include plenty of vegetables, fruits, low-fat dairy products, and lean protein. ? Avoid eating foods that are high in solid fats, added sugars, or salt (sodium).  Get regular exercise. This is one of the most important things that you can do for your health. ? Try to exercise for at least 150 minutes each week. The type of exercise that you do should increase your heart rate and make you sweat. This is known as moderate-intensity exercise. ? Try to do strengthening exercises at least twice each week. Do these in addition to the moderate-intensity exercise.  Know your numbers.Ask your health care provider to check your cholesterol and your blood glucose. Continue to have your blood tested as directed by your health care provider.  What should I know about cancer screening? There are several types of cancer. Take the following steps to reduce your risk and to catch any cancer development as early as possible. Breast Cancer  Practice breast self-awareness. ? This means understanding how your breasts normally appear  and feel. ? It also means doing regular breast self-exams. Let your health care provider know about any changes, no matter how small.  If you are 24 or older, have a clinician do a breast exam (clinical breast exam or CBE) every year. Depending  on your age, family history, and medical history, it may be recommended that you also have a yearly breast X-ray (mammogram).  If you have a family history of breast cancer, talk with your health care provider about genetic screening.  If you are at high risk for breast cancer, talk with your health care provider about having an MRI and a mammogram every year.  Breast cancer (BRCA) gene test is recommended for women who have family members with BRCA-related cancers. Results of the assessment will determine the need for genetic counseling and BRCA1 and for BRCA2 testing. BRCA-related cancers include these types: ? Breast. This occurs in males or females. ? Ovarian. ? Tubal. This may also be called fallopian tube cancer. ? Cancer of the abdominal or pelvic lining (peritoneal cancer). ? Prostate. ? Pancreatic.  Cervical, Uterine, and Ovarian Cancer Your health care provider may recommend that you be screened regularly for cancer of the pelvic organs. These include your ovaries, uterus, and vagina. This screening involves a pelvic exam, which includes checking for microscopic changes to the surface of your cervix (Pap test).  For women ages 21-65, health care providers may recommend a pelvic exam and a Pap test every three years. For women ages 18-65, they may recommend the Pap test and pelvic exam, combined with testing for human papilloma virus (HPV), every five years. Some types of HPV increase your risk of cervical cancer. Testing for HPV may also be done on women of any age who have unclear Pap test results.  Other health care providers may not recommend any screening for nonpregnant women who are considered low risk for pelvic cancer and have no symptoms. Ask your health care provider if a screening pelvic exam is right for you.  If you have had past treatment for cervical cancer or a condition that could lead to cancer, you need Pap tests and screening for cancer for at least 20 years after  your treatment. If Pap tests have been discontinued for you, your risk factors (such as having a new sexual partner) need to be reassessed to determine if you should start having screenings again. Some women have medical problems that increase the chance of getting cervical cancer. In these cases, your health care provider may recommend that you have screening and Pap tests more often.  If you have a family history of uterine cancer or ovarian cancer, talk with your health care provider about genetic screening.  If you have vaginal bleeding after reaching menopause, tell your health care provider.  There are currently no reliable tests available to screen for ovarian cancer.  Lung Cancer Lung cancer screening is recommended for adults 55-63 years old who are at high risk for lung cancer because of a history of smoking. A yearly low-dose CT scan of the lungs is recommended if you:  Currently smoke.  Have a history of at least 30 pack-years of smoking and you currently smoke or have quit within the past 15 years. A pack-year is smoking an average of one pack of cigarettes per day for one year.  Yearly screening should:  Continue until it has been 15 years since you quit.  Stop if you develop a health problem that would prevent you from  having lung cancer treatment.  Colorectal Cancer  This type of cancer can be detected and can often be prevented.  Routine colorectal cancer screening usually begins at age 23 and continues through age 55.  If you have risk factors for colon cancer, your health care provider may recommend that you be screened at an earlier age.  If you have a family history of colorectal cancer, talk with your health care provider about genetic screening.  Your health care provider may also recommend using home test kits to check for hidden blood in your stool.  A small camera at the end of a tube can be used to examine your colon directly (sigmoidoscopy or colonoscopy).  This is done to check for the earliest forms of colorectal cancer.  Direct examination of the colon should be repeated every 5-10 years until age 63. However, if early forms of precancerous polyps or small growths are found or if you have a family history or genetic risk for colorectal cancer, you may need to be screened more often.  Skin Cancer  Check your skin from head to toe regularly.  Monitor any moles. Be sure to tell your health care provider: ? About any new moles or changes in moles, especially if there is a change in a mole's shape or color. ? If you have a mole that is larger than the size of a pencil eraser.  If any of your family members has a history of skin cancer, especially at a young age, talk with your health care provider about genetic screening.  Always use sunscreen. Apply sunscreen liberally and repeatedly throughout the day.  Whenever you are outside, protect yourself by wearing long sleeves, pants, a wide-brimmed hat, and sunglasses.  What should I know about osteoporosis? Osteoporosis is a condition in which bone destruction happens more quickly than new bone creation. After menopause, you may be at an increased risk for osteoporosis. To help prevent osteoporosis or the bone fractures that can happen because of osteoporosis, the following is recommended:  If you are 2-27 years old, get at least 1,000 mg of calcium and at least 600 mg of vitamin D per day.  If you are older than age 2 but younger than age 64, get at least 1,200 mg of calcium and at least 600 mg of vitamin D per day.  If you are older than age 62, get at least 1,200 mg of calcium and at least 800 mg of vitamin D per day.  Smoking and excessive alcohol intake increase the risk of osteoporosis. Eat foods that are rich in calcium and vitamin D, and do weight-bearing exercises several times each week as directed by your health care provider. What should I know about how menopause affects my mental  health? Depression may occur at any age, but it is more common as you become older. Common symptoms of depression include:  Low or sad mood.  Changes in sleep patterns.  Changes in appetite or eating patterns.  Feeling an overall lack of motivation or enjoyment of activities that you previously enjoyed.  Frequent crying spells.  Talk with your health care provider if you think that you are experiencing depression. What should I know about immunizations? It is important that you get and maintain your immunizations. These include:  Tetanus, diphtheria, and pertussis (Tdap) booster vaccine.  Influenza every year before the flu season begins.  Pneumonia vaccine.  Shingles vaccine.  Your health care provider may also recommend other immunizations. This information is not  intended to replace advice given to you by your health care provider. Make sure you discuss any questions you have with your health care provider. Document Released: 12/03/2005 Document Revised: 04/30/2016 Document Reviewed: 07/15/2015 [employer]vier Interactive Patient Education  2018 [employer].

## 2018-08-31 NOTE — Progress Notes (Signed)
Patient ID: Jill Mays, female    DOB: 05-Feb-1971  Age: 47 y.o. MRN: [id number]  The patient is here for annual wellness examination and management of other chronic and acute problems.   /p tah bso S/p prophylactic bilateral mastectomy for FH of BRCA  reconstriction doen in 2011    The risk factors are reflected in the social history.  The roster of all physicians providing medical care to patient - is listed in the Snapshot section of the chart.  Activities of daily living:  The patient is 100% independent in all ADLs: dressing, toileting, feeding as well as independent mobility  Home safety : The patient has smoke detectors in the home. They wear seatbelts.  There are no firearms at home. There is no violence in the home.   There is no risks for hepatitis, STDs or HIV. There is no   history of blood transfusion. They have no travel history to infectious disease endemic areas of the world.  The patient has seen their dentist in the last six month. They have seen their eye doctor in the last year. They deny hearing  difficulty with regard to whispered voices and some television programs.  T  They do not  have excessive sun exposure. Discussed the need for sun protection: hats, long sleeves and use of sunscreen if there is significant sun exposure.   Diet: the importance of a healthy diet is discussed. They do have a healthy diet.  The benefits of regular aerobic exercise were discussed. She exercises 3 times per week ,  60  minutes.   Depression screen: there are no signs or vegative symptoms of depression- irritability, change in appetite, anhedonia, sadness/tearfullness.   The following portions of the patient's history were reviewed and updated as appropriate: allergies, current medications, past family history, past medical history,  past surgical history, past social history  and problem list.  Visual acuity was not assessed per patient preference since she has regular follow up  with her ophthalmologist. Hearing and body mass index were assessed and reviewed.   During the course of the visit the patient was educated and counseled about appropriate screening and preventive services including : fall prevention , diabetes screening, nutrition counseling, colorectal cancer screening, and recommended immunizations.    CC: The primary encounter diagnosis was Acute upper back pain. Diagnoses of Muscle spasm of back, Restless legs, Screening for hyperlipidemia, Encounter for preventive health examination, Acute right-sided thoracic back pain, and RLS (restless legs syndrome) were also pertinent to this visit.   RLS episodic worse after heavy exercise  Occurring at bedtime about 3/month discussed meds   Had an episode of low back pain  after carrying heavy luggage back from [place].   Pain occurred the following day . given a back brace,  NSAID and flexeril / the flexeril made the RLS worse.  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   She has a past surgical history that includes Abdominal hysterectomy; Breast surgery; and Bilateral oophorectomy.   Her family history includes Alcohol abuse in her father; Cancer in her maternal grandmother; Cancer (age of onset: 65) in her mother; Cancer (age of onset: 64) in her maternal  aunt; Heart disease (age of onset: 76) in her father; Hypertension in her father.She reports that she has never smoked. She has never used smokeless tobacco. She reports that she drinks alcohol. She reports that she does not use drugs.  Outpatient Medications Prior to Visit  Medication Sig Dispense Refill  . Ascorbic Acid (VITAMIN C) 100 MG tablet Take  500 mg by mouth 2 (two) times daily.     . Calcium Carbonate-Vitamin D (CALCIUM + D) 600-200 MG-UNIT TABS Take by mouth.      . doxylamine, Sleep, (UNISOM) 25 MG tablet Take 25 mg by mouth at bedtime as needed.    Marland Kitchen EPINEPHrine (EPIPEN) 0.3 mg/0.3 mL DEVI Inject 0.3 mLs (0.3 mg total) into the muscle once. 1 Device 1  . Multiple Vitamin (MULTIVITAMIN) tablet Take 1 tablet by mouth daily.      Marland Kitchen PREMARIN 0.625 MG tablet TAKE 1 TABLET (0.625 MG TOTAL) BY MOUTH DAILY. 90 tablet 3  . FLUoxetine (PROZAC) 20 MG capsule TAKE 1 CAPSULE BY MOUTH DAILY. 90 capsule 0  . ferrous sulfate 325 (65 FE) MG tablet Take 325 mg by mouth daily with breakfast.     No facility-administered medications prior to visit.     Review of Systems   Patient denies headache, fevers, malaise, unintentional weight loss, skin rash, eye pain, sinus congestion and sinus pain, sore throat, dysphagia,  hemoptysis , cough, dyspnea, wheezing, chest pain, palpitations, orthopnea, edema, abdominal pain, nausea, melena, diarrhea, constipation, flank pain, dysuria, hematuria, urinary  Frequency, nocturia, numbness, tingling, seizures,  Focal weakness, Loss of consciousness,  Tremor, insomnia, depression, anxiety, and suicidal ideation.      Objective:  BP 94/66 (BP Location: Left Arm, Patient Position: Sitting, Cuff Size: Normal)   Pulse 60   Temp 97.8 F (36.6 C) (Oral)   Resp 14   Ht 5' 2.5" (1.588 m)   Wt 115 lb 9.6 oz (52.4 kg)   SpO2 97%   BMI 20.81 kg/m   Physical Exam   General appearance: alert, cooperative and appears stated age Head: Normocephalic, without obvious abnormality, atraumatic Eyes: conjunctivae/corneas clear. PERRL, EOM's intact. Fundi benign. Ears: normal TM's and external ear canals both ears Nose: Nares normal. Septum midline. Mucosa normal. No drainage or sinus tenderness. Throat: lips, mucosa, and tongue normal; teeth and gums normal Neck: no adenopathy, no carotid bruit, no JVD, supple,  symmetrical, trachea midline and thyroid not enlarged, symmetric, no tenderness/mass/nodules Lungs: clear to auscultation bilaterally Breasts: s/p mastectomy , bilateral with reconstruction/implants  Heart: regular rate and rhythm, S1, S2 normal, no murmur, click, rub or gallop Abdomen: soft, non-tender; bowel sounds normal; no masses,  no organomegaly Extremities: extremities normal, atraumatic, no cyanosis or edema Pulses: 2+ and symmetric Skin: Skin color, texture, turgor normal. No rashes or lesions Neurologic: Alert and oriented X 3, normal strength and tone. Normal symmetric reflexes. Normal coordination and gait.      Assessment & Plan:   Problem List Items Addressed This Visit    Back pain, thoracic    Recurrent ,  Secondary to muscle strain from carrying having luggage.  PT referral in process to [facility]       Encounter for preventive health examination    age appropriate education and counseling updated, referrals for preventative services and immunizations addressed, dietary and smoking counseling addressed, most recent labs reviewed.  I have personally reviewed and have noted:  1) the patient's medical and social history 2) The pt's use of alcohol, tobacco, and illicit  drugs 3) The patient's current medications and supplements 4) Functional ability including ADL's, fall risk, home safety risk, hearing and visual impairment 5) Diet and physical activities 6) Evidence for depression or mood disorder 7) The patient's height, weight, and BMI have been recorded in the chart  I have made referrals, and provided counseling and education based on review of the above      RLS (restless legs syndrome)    Symptoms are infrequent and aggravated by vigorous exercise. Recommend trial of magnesium, wants to avoid medications. iron stores are norma.   Lab Results  Component Value Date   IRON 131 08/31/2018   TIBC 409 08/31/2018   FERRITIN 70 08/31/2018          Other Visit  Diagnoses    Acute upper back pain    -  Primary   Relevant Orders   Ambulatory referral to Physical Therapy   Muscle spasm of back       Relevant Orders   Magnesium (Completed)   TSH (Completed)   Restless legs       Relevant Orders   Comprehensive metabolic panel (Completed)   Iron, TIBC and Ferritin Panel (Completed)   CBC with Differential/Platelet (Completed)   Screening for hyperlipidemia       Relevant Orders   Lipid panel (Completed)      I have discontinued Brittany G. Dolezal's ferrous sulfate. I have also changed her FLUoxetine. Additionally, I am having her maintain her multivitamin, vitamin C, Calcium Carbonate-Vitamin D, EPINEPHrine, doxylamine (Sleep), and PREMARIN.  Meds ordered this encounter  Medications  . FLUoxetine (PROZAC) 20 MG capsule    Sig: Take 1 capsule (20 mg total) by mouth daily.    Dispense:  90 capsule    Refill:  0    KEEP ON FILE FOR NEXT  REFILL    Medications Discontinued During This Encounter  Medication Reason  . ferrous sulfate 325 (65 FE) MG tablet Error  . FLUoxetine (PROZAC) 20 MG capsule Reorder    Follow-up: No follow-ups on file.   Crecencio Mc, MD

## 2018-09-01 LAB — IRON,TIBC AND FERRITIN PANEL
%SAT: 32 % (calc) (ref 16–45)
FERRITIN: 70 ng/mL (ref 16–232)
IRON: 131 ug/dL (ref 40–190)
TIBC: 409 mcg/dL (calc) (ref 250–450)

## 2018-09-02 DIAGNOSIS — G2581 Restless legs syndrome: Secondary | ICD-10-CM | POA: Insufficient documentation

## 2018-09-02 NOTE — Assessment & Plan Note (Signed)
Recurrent ,  Secondary to muscle strain from carrying having luggage.  PT referral in process to [facility] PT

## 2018-09-02 NOTE — Assessment & Plan Note (Signed)

## 2018-09-02 NOTE — Assessment & Plan Note (Signed)
Symptoms are infrequent and aggravated by vigorous exercise. Recommend trial of magnesium, wants to avoid medications. iron stores are norma.   Lab Results  Component Value Date   IRON 131 08/31/2018   TIBC 409 08/31/2018   FERRITIN 70 08/31/2018

## 2018-10-16 ENCOUNTER — Other Ambulatory Visit: Payer: Self-pay | Admitting: Internal Medicine

## 2018-10-16 DIAGNOSIS — E8940 Asymptomatic postprocedural ovarian failure: Secondary | ICD-10-CM

## 2019-01-27 ENCOUNTER — Other Ambulatory Visit: Payer: Self-pay | Admitting: Internal Medicine

## 2019-01-29 NOTE — Telephone Encounter (Signed)
Please REMIND (OR TEACH) Jill Mays to start supplying information about last office visit for refill requests..  Please notify patient that the prescription  was Refilled for 30 days only because it has been 15  months since last visit. . Virtual  OFFICE VISIT NEEDED prior to any more refills

## 2019-01-29 NOTE — Telephone Encounter (Signed)
Requesting refill on prozac. Baldwin Racicot,cma

## 2019-01-30 NOTE — Telephone Encounter (Signed)
noted 

## 2019-01-31 ENCOUNTER — Other Ambulatory Visit: Payer: Self-pay

## 2019-01-31 ENCOUNTER — Other Ambulatory Visit: Payer: Self-pay | Admitting: Internal Medicine

## 2019-01-31 DIAGNOSIS — E8940 Asymptomatic postprocedural ovarian failure: Secondary | ICD-10-CM

## 2019-01-31 MED ORDER — ESTROGENS CONJUGATED 0.625 MG PO TABS: ORAL_TABLET | ORAL | 3 refills | Status: DC

## 2019-01-31 MED ORDER — FLUOXETINE HCL 10 MG PO CAPS: 10.0000 mg | ORAL_CAPSULE | Freq: Every day | ORAL | 0 refills | Status: DC

## 2019-02-27 ENCOUNTER — Other Ambulatory Visit: Payer: Self-pay | Admitting: Internal Medicine

## 2019-02-28 ENCOUNTER — Other Ambulatory Visit: Payer: Self-pay

## 2019-02-28 ENCOUNTER — Encounter: Payer: Self-pay | Admitting: Internal Medicine

## 2019-02-28 ENCOUNTER — Ambulatory Visit (INDEPENDENT_AMBULATORY_CARE_PROVIDER_SITE_OTHER): Payer: BC Managed Care – PPO | Admitting: Internal Medicine

## 2019-02-28 DIAGNOSIS — M546 Pain in thoracic spine: Secondary | ICD-10-CM

## 2019-02-28 DIAGNOSIS — F329 Major depressive disorder, single episode, unspecified: Secondary | ICD-10-CM

## 2019-02-28 DIAGNOSIS — G2581 Restless legs syndrome: Secondary | ICD-10-CM | POA: Diagnosis not present

## 2019-02-28 DIAGNOSIS — Z7189 Other specified counseling: Secondary | ICD-10-CM

## 2019-02-28 NOTE — Progress Notes (Signed)
Virtual Visit via [email or web address]  This visit type was conducted due to national recommendations for restrictions regarding the COVID-19 pandemic (e.g. social distancing).  This format is felt to be most appropriate for this patient at this time.  All issues noted in this document were discussed and addressed.  No physical exam was performed (except for noted visual exam findings with Video Visits).   I connected with@ on 02/28/19 at  9:00 AM EDT by a video enabled telemedicine application and verified that I am speaking with the correct person using two identifiers. Location patient: work  Location provider: work Persons participating in the virtual visit: patient, provider  I discussed the limitations, risks, security and privacy concerns of performing an evaluation and management service by telephone and the availability of in person appointments. I also discussed with the patient that there may be a patient responsible charge related to this service. The patient expressed understanding and agreed to proceed.  Reason for visit: 6 month follow up on chronic depression with insomnia   HPI:  Patient is feeling fine,  No depressive symptoms, and despite working as an Therapist, sports for ENT at [employer] she is not noticing an increase in anxiety.  Had a possible COVID 19 infection in mid march but was not tested and did not self quarantine bc [employer] did not advocate for it.   RLs intermittent worse on work out days .  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      Past Medical History:  Diagnosis Date  . Anxiety   . breast cancer    s/p bilateral mastectomy TAH/BSO  . Cellulitis due to methicillin-resistant Staphylococcus aureus (MRSA) May 2012   of breast (post reconstruction) presumed Strep or Staph  . Depression   . 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 Thyroid disease Oct 2012   elevated TSH per patient     Past Surgical History:  Procedure Laterality Date  . ABDOMINAL HYSTERECTOMY    . BILATERAL OOPHORECTOMY    . BREAST SURGERY      Family History  Problem Relation Age of Onset  . Cancer Mother 9       with recurrence, died in her 82s due to dementia  . Hypertension Father   . Alcohol abuse Father   . Heart disease Father 2  . Cancer Maternal Aunt 65       pancreatic CA  . Cancer Maternal Grandmother     SOCIAL HX: single mother.  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  Ascorbic Acid (VITAMIN C) 100 MG tablet, Take 500 mg by mouth 2 (two) times daily. ,  Disp: , Rfl:  .  Calcium Carbonate-Vitamin D (CALCIUM + D) 600-200 MG-UNIT TABS, Take by mouth.  , Disp: , Rfl:  .  doxylamine, Sleep, (UNISOM) 25 MG tablet, Take 25 mg by mouth at bedtime as needed., Disp: , Rfl:  .  EPINEPHrine (EPIPEN) 0.3 mg/0.3 mL DEVI, Inject 0.3 mLs (0.3 mg total) into the muscle once., Disp: 1 Device, Rfl: 1 .  estrogens, conjugated, (PREMARIN) 0.625 MG tablet, TAKE 1 TABLET (0.625 MG TOTAL) BY MOUTH DAILY., Disp: 90 tablet, Rfl: 3 .  FLUoxetine (PROZAC) 10 MG capsule, TAKE 1 CAPSULE BY MOUTH EVERY DAY, Disp:  90 capsule, Rfl: 1 .  Multiple Vitamin (MULTIVITAMIN) tablet, Take 1 tablet by mouth daily.  , Disp: , Rfl:   EXAM:  VITALS per patient if applicable:  GENERAL: alert, oriented, appears well and in no acute distress  HEENT: atraumatic, conjunttiva clear, no obvious abnormalities on inspection of external nose and ears  NECK: normal movements of the head and neck  LUNGS: on inspection no signs of respiratory distress, breathing rate appears normal, no obvious gross SOB, gasping or wheezing  CV: no obvious cyanosis  MS: moves all visible extremities without noticeable abnormality  PSYCH/NEURO: pleasant and cooperative, no obvious depression or anxiety, speech and thought processing grossly intact  ASSESSMENT AND PLAN:  Discussed the following assessment and plan:  Reactive depression  RLS (restless legs syndrome)  Educated About Covid-19 Virus Infection  Acute right-sided thoracic back pain  Depression Symptoms are well controlled on current medications. Continue generic prozac  And prn ambien for insomnia.Marland Kitchen  RLS (restless legs syndrome) Symptoms are infrequent and aggravated by vigorous exercise. Recommend trial of magnesium and pickle juice , wants to avoid medications. iron stores are normal.   Lab Results  Component Value Date   IRON 131 08/31/2018   TIBC 409 08/31/2018   FERRITIN 70 08/31/2018     Educated About Covid-19 Virus Infection Discussed her recent flu like illness in the setting of COVID-19 infection .  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  She is  washing hands frequently with soap and water,  using hand sanitizer if unable to wash, avoiding touching face,  ,but working as directed at [employer] ENT clinic.  Reviewed the lack of available testing for asymptomatic patients at this time.   Reminded patient to call office with questions/concerns.  The importance of social distancing was discussed today  Back pain,  thoracic Improved with PT and daily stretching.     I discussed the assessment and treatment plan with the patient. The patient was provided an opportunity to ask questions and all were answered. The patient agreed with the plan and demonstrated an understanding of the instructions.   The patient was advised to call back or seek an in-person evaluation if the symptoms worsen or if the condition fails to improve as anticipated.  I provided 25 minutes of non-face-to-face time during this encounter.   Crecencio Mc, MD

## 2019-03-01 ENCOUNTER — Ambulatory Visit: Payer: BC Managed Care – PPO | Admitting: Internal Medicine

## 2019-03-01 DIAGNOSIS — Z7189 Other specified counseling: Secondary | ICD-10-CM | POA: Insufficient documentation

## 2019-03-01 NOTE — Assessment & Plan Note (Signed)
Improved with PT and daily stretching.

## 2019-03-01 NOTE — Assessment & Plan Note (Signed)
Symptoms are infrequent and aggravated by vigorous exercise. Recommend trial of magnesium and pickle juice , wants to avoid medications. iron stores are normal.   Lab Results  Component Value Date   IRON 131 08/31/2018   TIBC 409 08/31/2018   FERRITIN 70 08/31/2018

## 2019-03-01 NOTE — Assessment & Plan Note (Signed)
Symptoms are well controlled on current medications. Continue generic prozac  And prn ambien for insomnia.Jill Mays

## 2019-03-01 NOTE — Assessment & Plan Note (Signed)
Discussed her recent flu like illness in the setting of COVID-19 infection .  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  She is  washing hands frequently with soap and water,  using hand sanitizer if unable to wash, avoiding touching face,  ,but working as directed at [employer] [employer] [employer] ENT clinic.  Reviewed the lack of available testing for asymptomatic patients at this time.   Reminded patient to call office with questions/concerns.  The importance of social distancing was discussed today

## 2019-05-16 ENCOUNTER — Other Ambulatory Visit: Payer: Self-pay | Admitting: Internal Medicine

## 2019-05-16 ENCOUNTER — Ambulatory Visit: Payer: BC Managed Care – PPO | Admitting: Family

## 2019-05-16 ENCOUNTER — Ambulatory Visit: Payer: Self-pay | Admitting: Internal Medicine

## 2019-05-16 ENCOUNTER — Other Ambulatory Visit: Payer: Self-pay

## 2019-05-16 ENCOUNTER — Ambulatory Visit
Admission: RE | Admit: 2019-05-16 | Discharge: 2019-05-16 | Disposition: A | Discharge status: Home / Self Care | Payer: BC Managed Care – PPO | Source: Ambulatory Visit | Attending: Internal Medicine | Admitting: Internal Medicine

## 2019-05-16 ENCOUNTER — Inpatient Hospital Stay: Admission: RE | Admit: 2019-05-16 | Payer: BC Managed Care – PPO | Source: Ambulatory Visit

## 2019-05-16 DIAGNOSIS — R071 Chest pain on breathing: Secondary | ICD-10-CM

## 2019-05-16 MED ORDER — IOHEXOL 350 MG/ML SOLN
75.0000 mL | Freq: Once | INTRAVENOUS | Status: AC | PRN
  Administered 2019-05-16: 17:00:00 75 mL via INTRAVENOUS

## 2019-05-16 NOTE — Telephone Encounter (Signed)
FYI  They called me and CT angio negative for PE.   Full report in your box  I just wanted you to be aware.   Again, happy to reoffer appt with me if you dont have any openings.   Just let me know.

## 2019-05-16 NOTE — Telephone Encounter (Signed)
Dr Derrel Nip,  Julu[facility] Rainier.  Patient came off my schedule as she in route to have CT angio.  I assume you will speak with her regarding results since will come to you. Happy to see her this Friday.

## 2019-05-16 NOTE — Telephone Encounter (Signed)
Pt. Reports she started having chest pain last Friday. Right-sided.Went to UC and they " cleared me for cardiac or gallbladder." Reports she is still having pain at her right ribs that goes around to her back. Hurts worse with certain movements, deep breaths, hiccups. No availability per Santiago Glad. Please advise pt.  Answer Assessment - Initial Assessment Questions 1. LOCATION: "Where does it hurt?"       Right side at ribs 2. RADIATION: "Does the pain go anywhere else?" (e.g., into neck, jaw, arms, back)     Goes to back 3. ONSET: "When did the chest pain begin?" (Minutes, hours or days)      Started Friday 4. PATTERN "Does the pain come and go, or has it been constant since it started?"  "Does it get worse with exertion?"      Comes and goes 5. DURATION: "How long does it last" (e.g., seconds, minutes, hours)     Minutes 6. SEVERITY: "How bad is the pain?"  (e.g., Scale 1-10; mild, moderate, or severe)    - MILD (1-3): doesn't interfere with normal activities     - MODERATE (4-7): interferes with normal activities or awakens from sleep    - SEVERE (8-10): excruciating pain, unable to do any normal activities       7 7. CARDIAC RISK FACTORS: "Do you have any history of heart problems or risk factors for heart disease?" (e.g., angina, prior heart attack; diabetes, high blood pressure, high cholesterol, smoker, or strong family history of heart disease)     No 8. PULMONARY RISK FACTORS: "Do you have any history of lung disease?"  (e.g., blood clots in lung, asthma, emphysema, birth control pills)     No 9. CAUSE: "What do you think is causing the chest pain?"     Unsure 10. OTHER SYMPTOMS: "Do you have any other symptoms?" (e.g., dizziness, nausea, vomiting, sweating, fever, difficulty breathing, cough)       No 11. PREGNANCY: "Is there any chance you are pregnant?" "When was your last menstrual period?"       No  Protocols used: CHEST PAIN-A-AH

## 2019-05-16 NOTE — Telephone Encounter (Signed)
Pt has been scheduled for 3:30 with Joycelyn Schmid, NP.

## 2019-05-16 NOTE — Progress Notes (Signed)
Patient has a history of breast cancer and  Moderate osteopenia of spine by 2016 DEXA .  DDX of chest pain , pleuritic that radiates to back includes PE , vertebral fracture and dissecting aortic anuerysm,  noe f which can be ruled out with chest x ray

## 2019-05-18 NOTE — Telephone Encounter (Signed)
CT released to patient via  mychart

## 2019-08-30 ENCOUNTER — Other Ambulatory Visit: Payer: Self-pay | Admitting: Internal Medicine

## 2019-10-28 ENCOUNTER — Other Ambulatory Visit: Payer: Self-pay | Admitting: Internal Medicine

## 2019-10-28 DIAGNOSIS — E8940 Asymptomatic postprocedural ovarian failure: Secondary | ICD-10-CM

## 2019-11-15 ENCOUNTER — Telehealth: Payer: Self-pay | Admitting: Internal Medicine

## 2019-11-15 DIAGNOSIS — E8940 Asymptomatic postprocedural ovarian failure: Secondary | ICD-10-CM

## 2019-11-15 NOTE — Telephone Encounter (Signed)
Pt called stating that a form needs to be filled out for Prior Auth for medication estrogens, conjugated, (PREMARIN) 0.625 MG tablet pt called 1 week ago.   Pharmacy is [facility], [place] - [id number]  Call pt @ [phone]

## 2019-11-16 ENCOUNTER — Telehealth: Payer: Self-pay | Admitting: Lab

## 2019-11-16 ENCOUNTER — Telehealth: Payer: Self-pay | Admitting: Internal Medicine

## 2019-11-16 DIAGNOSIS — E8940 Asymptomatic postprocedural ovarian failure: Secondary | ICD-10-CM

## 2019-11-16 MED ORDER — ESTROGENS CONJUGATED 0.625 MG PO TABS: ORAL_TABLET | ORAL | 3 refills | Status: DC

## 2019-11-16 NOTE — Telephone Encounter (Signed)
Pt called back regarding medication refill she is out of them for 1 week now. Please advise and Thank you!

## 2019-11-16 NOTE — Telephone Encounter (Signed)
Called Pt No answer, No VM to leave message.

## 2019-11-16 NOTE — Telephone Encounter (Signed)
Refilled , please have her  schedule ov

## 2019-11-16 NOTE — Addendum Note (Signed)
Addended by: Crecencio Mc on: 11/16/2019 12:55 PM   Modules accepted: Orders

## 2019-11-16 NOTE — Telephone Encounter (Signed)
Last refill 01/31/19 and last OV 02/28/19 Ok to refill , estrogen Tablet (Premarin)?

## 2019-11-19 ENCOUNTER — Telehealth: Payer: Self-pay | Admitting: Internal Medicine

## 2019-11-21 NOTE — Telephone Encounter (Signed)
Dr Derrel Nip had ordered premarin (x 1 year).  Pharmacy requested change in estrogen (estrace 1mg )  since premarin not covered.  I ok'd change.  Please schedule pt a f/u with Dr Derrel Nip.  She is overdue.

## 2019-11-23 NOTE — Telephone Encounter (Signed)
Pt wants to stay on Permarin and is checking on the PA

## 2019-11-23 NOTE — Telephone Encounter (Signed)
err

## 2019-11-26 NOTE — Telephone Encounter (Signed)
PA has been submitted.

## 2019-11-28 ENCOUNTER — Ambulatory Visit (INDEPENDENT_AMBULATORY_CARE_PROVIDER_SITE_OTHER): Payer: BC Managed Care – PPO | Admitting: Internal Medicine

## 2019-11-28 ENCOUNTER — Other Ambulatory Visit: Payer: Self-pay

## 2019-11-28 ENCOUNTER — Encounter: Payer: Self-pay | Admitting: Internal Medicine

## 2019-11-28 VITALS — Ht 62.5 in | Wt 115.0 lb

## 2019-11-28 DIAGNOSIS — E8940 Asymptomatic postprocedural ovarian failure: Secondary | ICD-10-CM

## 2019-11-28 DIAGNOSIS — E8941 Symptomatic postprocedural ovarian failure: Secondary | ICD-10-CM

## 2019-11-28 DIAGNOSIS — M8580 Other specified disorders of bone density and structure, unspecified site: Secondary | ICD-10-CM | POA: Insufficient documentation

## 2019-11-28 MED ORDER — ESTRADIOL 1 MG PO TABS: 1.0000 mg | ORAL_TABLET | Freq: Every day | ORAL | 0 refills | Status: DC

## 2019-11-28 NOTE — Assessment & Plan Note (Signed)
despite use of HRT since surgery,  t scores were moderate range in 2016.  Repeat advised and therapy will be recommended if there has been progression

## 2019-11-28 NOTE — Assessment & Plan Note (Signed)
With osteoporosis and flushing, vaginal dryness as primary symptoms. Premarin no longer covered.  Estradiol prescribed.  She is s/p TAHBSO

## 2019-11-28 NOTE — Progress Notes (Signed)
Virtual Visit via [email or web address]  This visit type was conducted due to national recommendations for restrictions regarding the COVID-19 pandemic (e.g. social distancing).  This format is felt to be most appropriate for this patient at this time.  All issues noted in this document were discussed and addressed.  No physical exam was performed (except for noted visual exam findings with Video Visits).   I connected with@ on 11/28/19 at  8:30 AM EST by a video enabled telemedicine applicationand verified that I am speaking with the correct person using two identifiers. Location patient: home Location provider: work or home office Persons participating in the virtual visit: patient, provider  I discussed the limitations, risks, security and privacy concerns of performing an evaluation and management service by telephone and the availability of in person appointments. I also discussed with the patient that there may be a patient responsible charge related to this service. The patient expressed understanding and agreed to proceed.   Reason for visit: follow up   HPI:  49 yr old female with family history of BRCA s/p prophylactic bilateral mastectomy, , TAH/BSO on HRT since oophorectomy presents for follow up on multiple issues  Surgical menopause:  Has been unable to continue Premarin due to cost (now not covered by insurance,  Costing her $7/pill) and lack of communication from office about the similarity of estradiol with Premarin.    Osteopenia:  T scores from 2016 DEXA reviewed.  Calcium  Vitamin D requirements and intake reviewed.    The patient has no signs or symptoms of COVID 19 infection (fever, cough, sore throat  or shortness of breath beyond what is typical for patient) despite her son becoming infected over the Christmas holiday which he spend with his father.  dhe was tested tiwce and negative.  Has received the pfizer vaccine x 2.   ROS: See pertinent positives and negatives per HPI.  Past  Medical History:  Diagnosis Date  . Anxiety   . breast cancer    s/p bilateral mastectomy TAH/BSO  . Cellulitis due to methicillin-resistant Staphylococcus aureus (MRSA) May 2012   of breast (post reconstruction) presumed Strep or Staph  . Depression   . 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 Thyroid disease Oct 2012   elevated TSH per patient     Past Surgical History:  Procedure Laterality Date  . ABDOMINAL HYSTERECTOMY    . BILATERAL OOPHORECTOMY    . BREAST SURGERY      Family History  Problem Relation Age of Onset  . Cancer Mother 53       with recurrence, died in her 76s due to dementia  . Hypertension Father   . Alcohol abuse Father   . Heart disease Father 85  . Cancer Maternal Aunt 65       pancreatic CA  . Cancer Maternal Grandmother     SOCIAL HX:  reports that she has never smoked. She has never used smokeless tobacco. She reports current alcohol use of about 5.0 standard drinks of alcohol per week. She reports that she does not use drugs.   Current Outpatient Medications:  .  Ascorbic Acid (VITAMIN C) 100 MG tablet, Take 500 mg by mouth 2 (two) times daily. , Disp: , Rfl:  .  Calcium Carbonate-Vitamin D (CALCIUM + D) 600-200 MG-UNIT TABS, Take by mouth.  , Disp: , Rfl:  .  doxylamine, Sleep, (UNISOM) 25 MG tablet, Take 25 mg  by mouth at bedtime as needed., Disp: , Rfl:  .  EPINEPHrine (EPIPEN) 0.3 mg/0.3 mL DEVI, Inject 0.3 mLs (0.3 mg total) into the muscle once., Disp: 1 Device, Rfl: 1 .  estradiol (ESTRACE) 1 MG tablet, Take 1 tablet (1 mg total) by mouth daily., Disp: 90 tablet, Rfl: 0 .  FLUoxetine (PROZAC) 10 MG capsule, TAKE 1 CAPSULE BY MOUTH EVERY DAY, Disp: 90 capsule, Rfl: 1 .  Multiple Vitamin (MULTIVITAMIN) tablet, Take 1 tablet by mouth daily.  , Disp: , Rfl:  .  triamcinolone cream (KENALOG) 0.5 %, APPLY TOPICALLY TWO (2) TIMES A DAY FOR 10 DAYS., Disp: , Rfl:    EXAM:  VITALS per patient if applicable:  GENERAL: alert, oriented, appears well and in no acute distress  HEENT: atraumatic, conjunttiva clear, no obvious abnormalities on inspection of external nose and ears  NECK: normal movements of the head and neck  LUNGS: on inspection no signs of respiratory distress, breathing rate appears normal, no obvious gross SOB, gasping or wheezing  CV: no obvious cyanosis  MS: moves all visible extremities without noticeable abnormality  PSYCH/NEURO: pleasant and cooperative, no obvious depression or anxiety, speech and thought processing grossly intact  ASSESSMENT AND PLAN:  Discussed the following assessment and plan:  Osteopenia due to cancer therapy - Plan: DG Bone Density  Postsurgical menopause - Plan: estradiol (ESTRACE) 1 MG tablet, DG Bone Density  Symptomatic postsurgical menopause  Symptomatic postsurgical menopause With osteoporosis and flushing, vaginal dryness as primary symptoms. Premarin no longer covered.  Estradiol prescribed.  She is s/p TAHBSO  Osteopenia due to cancer therapy despite use of HRT since surgery,  t scores were moderate range in 2016.  Repeat advised and therapy will be recommended if there has been progression     I discussed the assessment and treatment plan with the patient. The patient was provided an opportunity to ask questions and all were answered. The patient agreed with the plan and demonstrated an understanding of the instructions.   The patient was advised to call back or seek an in-person evaluation if the symptoms worsen or if the condition fails to improve as anticipated.  I provided 30 minutes of non-face-to-face time during this encounter reviewing patient's current problems and past procedures/imaging studies, providing counseling on the above mentioned problems , and coordination  of care .  Crecencio Mc, MD

## 2020-02-21 ENCOUNTER — Encounter: Payer: Self-pay | Admitting: General Practice

## 2020-02-28 ENCOUNTER — Other Ambulatory Visit: Payer: Self-pay

## 2020-02-28 DIAGNOSIS — E8940 Asymptomatic postprocedural ovarian failure: Secondary | ICD-10-CM

## 2020-02-28 MED ORDER — ESTRADIOL 1 MG PO TABS: 1.0000 mg | ORAL_TABLET | Freq: Every day | ORAL | 0 refills | Status: DC

## 2020-03-05 NOTE — Telephone Encounter (Signed)
You have mild osteopenia by your recent DEXA , meaning that you have had some bone loss,  but not enough to call it osteoporosis.  Your spine density  Decreased slightly  But your hip density increased.  I recommend continued weight bearing exercise, a goal intake of 1200 mg calcium through diet /supplements ,  A minimum of  1000 units Vit D daily  And we will repeat your DEXA scan in 5 years .  I recommend getting the majority of your calcium and Vitamin D  through dietary sources rather than supplements given the recent association of calcium supplements with increased coronary artery calcium scores  .  Almond/coconut milk  and cashew milks  are great nondairy alternatives to dairy sources for calcium  and are  cholesterol free .  Regards,  Dr. Derrel Nip

## 2020-03-07 ENCOUNTER — Other Ambulatory Visit: Payer: Self-pay | Admitting: Internal Medicine

## 2020-05-31 ENCOUNTER — Other Ambulatory Visit: Payer: Self-pay | Admitting: Internal Medicine

## 2020-05-31 DIAGNOSIS — E8940 Asymptomatic postprocedural ovarian failure: Secondary | ICD-10-CM

## 2020-09-03 ENCOUNTER — Other Ambulatory Visit: Payer: Self-pay | Admitting: Internal Medicine

## 2020-09-10 ENCOUNTER — Other Ambulatory Visit: Payer: Self-pay | Admitting: Internal Medicine

## 2020-09-10 DIAGNOSIS — E8940 Asymptomatic postprocedural ovarian failure: Secondary | ICD-10-CM

## 2020-09-10 MED ORDER — ESTRADIOL 1 MG PO TABS: 1.0000 mg | ORAL_TABLET | Freq: Every day | ORAL | 0 refills | Status: DC

## 2020-11-18 ENCOUNTER — Encounter: Payer: Self-pay | Admitting: *Deleted

## 2020-12-07 ENCOUNTER — Other Ambulatory Visit: Payer: Self-pay | Admitting: Internal Medicine

## 2020-12-17 ENCOUNTER — Other Ambulatory Visit: Payer: Self-pay

## 2020-12-17 ENCOUNTER — Encounter: Payer: Self-pay | Admitting: Internal Medicine

## 2020-12-17 ENCOUNTER — Ambulatory Visit (INDEPENDENT_AMBULATORY_CARE_PROVIDER_SITE_OTHER): Payer: BC Managed Care – PPO | Admitting: Internal Medicine

## 2020-12-17 VITALS — BP 90/62 | HR 78 | Temp 98.0°F | Ht 62.52 in | Wt 112.0 lb

## 2020-12-17 DIAGNOSIS — L509 Urticaria, unspecified: Secondary | ICD-10-CM

## 2020-12-17 DIAGNOSIS — Z8616 Personal history of COVID-19: Secondary | ICD-10-CM

## 2020-12-17 DIAGNOSIS — R5383 Other fatigue: Secondary | ICD-10-CM | POA: Diagnosis not present

## 2020-12-17 DIAGNOSIS — E785 Hyperlipidemia, unspecified: Secondary | ICD-10-CM

## 2020-12-17 DIAGNOSIS — E559 Vitamin D deficiency, unspecified: Secondary | ICD-10-CM

## 2020-12-17 DIAGNOSIS — Z0000 Encounter for general adult medical examination without abnormal findings: Secondary | ICD-10-CM

## 2020-12-17 LAB — CBC WITH DIFFERENTIAL/PLATELET
Basophils Absolute: 0.1 10*3/uL (ref 0.0–0.1)
Basophils Relative: 1.3 % (ref 0.0–3.0)
Eosinophils Absolute: 0.2 10*3/uL (ref 0.0–0.7)
Eosinophils Relative: 2.8 % (ref 0.0–5.0)
HCT: 39.4 % (ref 36.0–46.0)
Hemoglobin: 13.4 g/dL (ref 12.0–15.0)
Lymphocytes Relative: 32.3 % (ref 12.0–46.0)
Lymphs Abs: 2 10*3/uL (ref 0.7–4.0)
MCHC: 34.1 g/dL (ref 30.0–36.0)
MCV: 88.9 fl (ref 78.0–100.0)
Monocytes Absolute: 0.4 10*3/uL (ref 0.1–1.0)
Monocytes Relative: 6.8 % (ref 3.0–12.0)
Neutro Abs: 3.6 10*3/uL (ref 1.4–7.7)
Neutrophils Relative %: 56.8 % (ref 43.0–77.0)
Platelets: 240 10*3/uL (ref 150.0–400.0)
RBC: 4.43 Mil/uL (ref 3.87–5.11)
RDW: 12.6 % (ref 11.5–15.5)
WBC: 6.3 10*3/uL (ref 4.0–10.5)

## 2020-12-17 LAB — LIPID PANEL
Cholesterol: 194 mg/dL (ref 0–200)
HDL: 73.6 mg/dL (ref >39.00)
LDL Cholesterol: 102 mg/dL — ABNORMAL HIGH (ref 0–99)
NonHDL: 120.89
Total CHOL/HDL Ratio: 3
Triglycerides: 96 mg/dL (ref 0.0–149.0)
VLDL: 19.2 mg/dL (ref 0.0–40.0)

## 2020-12-17 LAB — COMPREHENSIVE METABOLIC PANEL
ALT: 20 U/L (ref 0–35)
AST: 21 U/L (ref 0–37)
Albumin: 4.4 g/dL (ref 3.5–5.2)
Alkaline Phosphatase: 81 U/L (ref 39–117)
BUN: 25 mg/dL — ABNORMAL HIGH (ref 6–23)
CO2: 29 mEq/L (ref 19–32)
Calcium: 10.3 mg/dL (ref 8.4–10.5)
Chloride: 101 mEq/L (ref 96–112)
Creatinine, Ser: 1.04 mg/dL (ref 0.40–1.20)
GFR: 62.36 mL/min (ref >60.00)
Glucose, Bld: 80 mg/dL (ref 70–99)
Potassium: 4 mEq/L (ref 3.5–5.1)
Sodium: 137 mEq/L (ref 135–145)
Total Bilirubin: 0.4 mg/dL (ref 0.2–1.2)
Total Protein: 7.2 g/dL (ref 6.0–8.3)

## 2020-12-17 LAB — VITAMIN D 25 HYDROXY (VIT D DEFICIENCY, FRACTURES): VITD: 67 ng/mL (ref 30.00–100.00)

## 2020-12-17 MED ORDER — GABAPENTIN 100 MG PO CAPS: 100.0000 mg | ORAL_CAPSULE | Freq: Three times a day (TID) | ORAL | 3 refills | Status: DC

## 2020-12-17 NOTE — Patient Instructions (Signed)
Pick a GI specialist  and let me know who you want  Adding gabapentin (AFTER XOLAIRE START) for itching.  Start with 100 mg dose every 8 hours   prozac taper:  1 capsule every other day for 2 weeks ,  Then stop

## 2020-12-17 NOTE — Progress Notes (Signed)
Patient ID: Jill Mays, female    DOB: 12-01-1970  Age: 50 y.o. MRN: [id number]  The patient is here for annual preventive  examination and management of other chronic and acute problems.   The risk factors are reflected in the social history.  The roster of all physicians providing medical care to patient - is listed in the Snapshot section of the chart.  Activities of daily living:  The patient is 100% independent in all ADLs: dressing, toileting, feeding as well as independent mobility  Home safety : The patient has smoke detectors in the home. They wear seatbelts.  There are no firearms at home. There is no violence in the home.   There is no risks for hepatitis, STDs or HIV. There is no   history of blood transfusion. They have no travel history to infectious disease endemic areas of the world.  The patient has seen their dentist in the last six month. They have seen their eye doctor in the last year. She denies hearing difficulty with regard to whispered voices and some television programs.    They do not  have excessive sun exposure. Discussed the need for sun protection: hats, long sleeves and use of sunscreen if there is significant sun exposure.   Diet: the importance of a healthy diet is discussed. They do have a healthy diet.  The benefits of regular aerobic exercise were discussed. She exercises 4 times per week ,  60 minutes.   Depression screen: there are no signs or vegative symptoms of depression- irritability, change in appetite, anhedonia, sadness/tearfullness.   The following portions of the patient's history were reviewed and updated as appropriate: allergies, current medications, past family history, past medical history,  past surgical history, past social history  and problem list.  Visual acuity was not assessed per patient preference since she has regular follow up with her ophthalmologist. Hearing and body mass index were assessed and reviewed.   During the  course of the visit the patient was educated and counseled about appropriate screening and preventive services including : fall prevention , diabetes screening, nutrition counseling, colorectal cancer screening, and recommended immunizations.    CC: The primary encounter diagnosis was History of COVID-19. Diagnoses of Fatigue, unspecified type, Hyperlipidemia with target LDL less than 160, Vitamin D deficiency, Hives, and Encounter for preventive health examination were also pertinent to this visit.  Has been miserable with pruritic Hives x 15 months.  Attributed to getting the [place]  too soon after a documented exposure which was considered to be an asymptomatic COVID INFECTION . The rash  Started in scalp and spread to torso extremities and face.  Persistent despite zyrtec, singulair and placquenil .  Referred to and Saw immunology today.  Starting her on xolair.   Had a second infection, symptomatic Dec 30     History Jill Mays has a past medical history of Anxiety, breast cancer, Cellulitis due to methicillin-resistant Staphylococcus aureus (MRSA) (May 2012), Depression, RMS (red man syndrome) (May 2012), and Thyroid disease (Oct 2012).   She has a past surgical history that includes Abdominal hysterectomy; Breast surgery; and Bilateral oophorectomy.   Her family history includes Alcohol abuse in her father; Cancer in her maternal grandmother; Cancer (age of onset: 81) in her mother; Cancer (age of onset: 51) in her maternal aunt; Heart disease (age of onset: 22) in her father; Hypertension in her father.She reports that she has never smoked. She has never used smokeless tobacco. She reports current alcohol  use of about 5.0 standard drinks of alcohol per week. She reports that she does not use drugs.  Outpatient Medications Prior to Visit  Medication Sig Dispense Refill  . Ascorbic Acid (VITAMIN C) 100 MG tablet Take 500 mg by mouth 2 (two) times daily.    . Calcium Carbonate-Vitamin D  600-200 MG-UNIT TABS Take by mouth.    . cetirizine (ZYRTEC) 10 MG tablet Take 2 tablets in AM And 2 tablets in PM for hives    . clobetasol (TEMOVATE) 0.05 % external solution Apply topically.    Marland Kitchen doxylamine, Sleep, (UNISOM) 25 MG tablet Take 25 mg by mouth at bedtime as needed.    Marland Kitchen EPINEPHrine (EPIPEN) 0.3 mg/0.3 mL DEVI Inject 0.3 mLs (0.3 mg total) into the muscle once. 1 Device 1  . estradiol (ESTRACE) 1 MG tablet Take 1 tablet (1 mg total) by mouth daily. 90 tablet 0  . FLUoxetine (PROZAC) 10 MG capsule TAKE 1 CAPSULE BY MOUTH EVERY DAY 90 capsule 0  . hydroxychloroquine (PLAQUENIL) 200 MG tablet Take by mouth.    Marland Kitchen Lifitegrast (XIIDRA) 5 % SOLN Administer 1 drop to both eyes Two (2) times a day.    . montelukast (SINGULAIR) 10 MG tablet Take 1 tablet by mouth nightly to help treat hives    . Multiple Vitamin (MULTIVITAMIN) tablet Take 1 tablet by mouth daily.    Marland Kitchen triamcinolone cream (KENALOG) 0.5 % APPLY TOPICALLY TWO (2) TIMES A DAY FOR 10 DAYS.     No facility-administered medications prior to visit.    Review of Systems   Patient denies headache, fevers, malaise, unintentional weight loss, skin rash, eye pain, sinus congestion and sinus pain, sore throat, dysphagia,  hemoptysis , cough, dyspnea, wheezing, chest pain, palpitations, orthopnea, edema, abdominal pain, nausea, melena, diarrhea, constipation, flank pain, dysuria, hematuria, urinary  Frequency, nocturia, numbness, tingling, seizures,  Focal weakness, Loss of consciousness,  Tremor, insomnia, depression, anxiety, and suicidal ideation.      Objective:  BP 90/62 (BP Location: Left Arm, Patient Position: Sitting)   Pulse 78   Temp 98 F (36.7 C)   Ht 5' 2.52" (1.588 m)   Wt 112 lb (50.8 kg)   SpO2 97%   BMI 20.15 kg/m   Physical Exam  General appearance: alert, cooperative and appears stated age Head: Normocephalic, without obvious abnormality, atraumatic Eyes: conjunctivae/corneas clear. PERRL, EOM's intact.  Fundi benign. Ears: normal TM's and external ear canals both ears Nose: Nares normal. Septum midline. Mucosa normal. No drainage or sinus tenderness. Throat: lips, mucosa, and tongue normal; teeth and gums normal Neck: no adenopathy, no carotid bruit, no JVD, supple, symmetrical, trachea midline and thyroid not enlarged, symmetric, no tenderness/mass/nodules Lungs: clear to auscultation bilaterally Breasts: bilateral mastectomy s/p bilateral silicone prostheses,  Surgical scar left breast Heart: regular rate and rhythm, S1, S2 normal, no murmur, click, rub or gallop Abdomen: soft, non-tender; bowel sounds normal; no masses,  no organomegaly Extremities: extremities normal, atraumatic, no cyanosis or edema Pulses: 2+ and symmetric Skin: Skin color, texture, turgor normal. No rashes or lesions Neurologic: Alert and oriented X 3, normal strength and tone. Normal symmetric reflexes. Normal coordination and gait.    Assessment & Plan:   Problem List Items Addressed This Visit      Unprioritized   Hives    Chronic for 18 months  Considered to be a side effect of COVID vaccination following natural infection.  Patient is seeing immunology and about to start [place], .  Adding gabapentin  for itching       Encounter for preventive health examination    age appropriate education and counseling updated, referrals for preventative services and immunizations addressed, dietary and smoking counseling addressed, most recent labs reviewed.  I have personally reviewed and have noted:  1) the patient's medical and social history 2) The pt's use of alcohol, tobacco, and illicit drugs 3) The patient's current medications and supplements 4) Functional ability including ADL's, fall risk, home safety risk, hearing and visual impairment 5) Diet and physical activities 6) Evidence for depression or mood disorder 7) The patient's height, weight, and BMI have been recorded in the chart  I have made  referrals, and provided counseling and education based on review of the above       Other Visit Diagnoses    History of COVID-19    -  Primary   Relevant Orders   SARS-CoV-2 Semi-Quantitative Total Antibody, Spike   Fatigue, unspecified type       Relevant Orders   CBC with Differential/Platelet (Completed)   Comprehensive metabolic panel (Completed)   Hyperlipidemia with target LDL less than 160       Relevant Orders   Lipid panel (Completed)   Vitamin D deficiency       Relevant Orders   VITAMIN D 25 Hydroxy (Vit-D Deficiency, Fractures) (Completed)      I am having Jill Mays start on gabapentin. I am also having her maintain her multivitamin, vitamin C, Calcium Carbonate-Vitamin D, EPINEPHrine, doxylamine (Sleep), triamcinolone cream, estradiol, FLUoxetine, cetirizine, clobetasol, hydroxychloroquine, Xiidra, and montelukast.  Meds ordered this encounter  Medications  . gabapentin (NEURONTIN) 100 MG capsule    Sig: Take 1 capsule (100 mg total) by mouth 3 (three) times daily.    Dispense:  90 capsule    Refill:  3    There are no discontinued medications.  Follow-up: No follow-ups on file.   Crecencio Mc, MD

## 2020-12-18 DIAGNOSIS — L509 Urticaria, unspecified: Secondary | ICD-10-CM | POA: Insufficient documentation

## 2020-12-18 NOTE — Assessment & Plan Note (Signed)
Chronic for 18 months  Considered to be a side effect of COVID vaccination following natural infection.  Patient is seeing immunology and about to start [place], .  Adding gabapentin for itching

## 2020-12-18 NOTE — Assessment & Plan Note (Signed)

## 2020-12-23 LAB — SARS-COV-2 SEMI-QUANTITATIVE TOTAL ANTIBODY, SPIKE: SARS COV2 AB, Total Spike Semi QN: >2500 U/mL — ABNORMAL HIGH (ref <0.8)

## 2021-02-18 ENCOUNTER — Other Ambulatory Visit: Payer: Self-pay

## 2021-02-18 ENCOUNTER — Ambulatory Visit: Payer: BC Managed Care – PPO | Admitting: Adult Health

## 2021-02-18 ENCOUNTER — Encounter: Payer: Self-pay | Admitting: Adult Health

## 2021-02-18 VITALS — BP 120/64 | HR 74 | Temp 97.8°F | Ht 62.52 in | Wt 113.4 lb

## 2021-02-18 DIAGNOSIS — B379 Candidiasis, unspecified: Secondary | ICD-10-CM

## 2021-02-18 DIAGNOSIS — B3731 Acute candidiasis of vulva and vagina: Secondary | ICD-10-CM | POA: Insufficient documentation

## 2021-02-18 DIAGNOSIS — Z113 Encounter for screening for infections with a predominantly sexual mode of transmission: Secondary | ICD-10-CM

## 2021-02-18 DIAGNOSIS — N898 Other specified noninflammatory disorders of vagina: Secondary | ICD-10-CM | POA: Insufficient documentation

## 2021-02-18 DIAGNOSIS — S30814A Abrasion of vagina and vulva, initial encounter: Secondary | ICD-10-CM | POA: Insufficient documentation

## 2021-02-18 MED ORDER — FLUCONAZOLE 150 MG PO TABS: 150.0000 mg | ORAL_TABLET | ORAL | 0 refills | Status: DC

## 2021-02-18 NOTE — Patient Instructions (Signed)
Abrasion An abrasion is a cut or a scrape on your skin. You must take care of your wound so germs do not get in it and cause infection. What are the causes? This condition is caused by rubbing your skin on something or falling on a surface, such as the ground. When your skin rubs on something, some layers of skin may rub off. What are the signs or symptoms?  A cut or a scrape.  Bleeding.  A red or pink spot.  A bruise under your wound. How is this treated? This condition may be treated with:  Cleaning your wound.  Putting ointment on your wound.  Putting a bandage on your wound.  Getting a tetanus shot. Follow these instructions at home: Your doctor may tell you to do these things: Medicines  Take or use over-the-counter and prescription medicines only as told by your doctor.  If you were prescribed an antibiotic medicine, use it as told by your doctor. Do not stop using it even if you start to feel better. Keep your wound clean  Clean your wound 1 or 2 times a day or as often told by your doctor. To do this: 1. Wash your hands for at least 20 seconds with mild soap and water. Do this before and after you clean your wound. 2. Wash your wound with mild soap and water. 3. Rinse off the soap. 4. Pat your wound with a clean towel to dry it. Do not rub your wound.  Keep your bandage clean and dry. Take it off and change it as told by your doctor. ? You may have to change your bandage one or more times a day, or as told by your doctor. Watch for signs of infection Check your wound every day for signs of infection. Check for:  A red streak that goes away from your wound.  Other redness.  Swelling or more pain.  Warmth.  Blood, fluid, pus, or a bad smell. Treat pain and swelling  If told, put ice on the injured area. To do this: ? Put ice in a plastic bag. ? Place a towel between your skin and the bag. ? Leave the ice on for 20 minutes, 2-3 times a day. ? Take off  the ice if your skin turns bright red. This is very important. If you cannot feel pain, heat, or cold, you have a greater risk of damage to the area.  If you can, raise the injured area above the level of your heart while you are sitting or lying down.   General instructions  Do not take baths, swim, or use a hot tub. Ask your doctor about taking showers or sponge baths.  Keep all follow-up visits. Contact a doctor if:  You had a tetanus shot, and you have any of these where the needle went in: ? Swelling. ? Very bad pain. ? Redness. ? Bleeding.  You have a lot of pain, and medicine does not help.  You have a fever.  You have any of these signs of infection in your wound: ? Redness, swelling, or more pain. ? Blood, fluid, pus, or a bad smell. ? Warmth. Get help right away if:  You have a red streak going away from your wound. Summary  An abrasion is a cut or a scrape on your skin. Take care of your wound so germs do not get in it.  Clean your wound 1 or 2 times a day or as often as told. Change   your bandage as told and use medicines as told.  Call your doctor if you have a fever or if you have redness, swelling, or more pain in your wound.  Call your doctor if you have blood, fluid, pus, or a bad smell in your wound.  Get help right away if you have a red streak going away from your wound. This information is not intended to replace advice given to you by your health care provider. Make sure you discuss any questions you have with your health care provider. Document Revised: 01/10/2020 Document Reviewed: 01/10/2020 Elsevier Patient Education  2021 [place]. Vaginal Yeast Infection, Adult  Vaginal yeast infection is a condition that causes vaginal discharge as well as soreness, swelling, and redness (inflammation) of the vagina. This is a common condition. Some women get this infection frequently. What are the causes? This condition is caused by a change in the normal  balance of the yeast (candida) and bacteria that live in the vagina. This change causes an overgrowth of yeast, which causes the inflammation. What increases the risk? The condition is more likely to develop in women who:  Take antibiotic medicines.  Have diabetes.  Take birth control pills.  Are pregnant.  Douche often.  Have a weak body defense system (immune system).  Have been taking steroid medicines for a long time.  Frequently wear tight clothing. What are the signs or symptoms? Symptoms of this condition include:  White, thick, creamy vaginal discharge.  Swelling, itching, redness, and irritation of the vagina. The lips of the vagina (vulva) may be affected as well.  Pain or a burning feeling while urinating.  Pain during sex. How is this diagnosed? This condition is diagnosed based on:  Your medical history.  A physical exam.  A pelvic exam. Your health care provider will examine a sample of your vaginal discharge under a microscope. Your health care provider may send this sample for testing to confirm the diagnosis. How is this treated? This condition is treated with medicine. Medicines may be over-the-counter or prescription. You may be told to use one or more of the following:  Medicine that is taken by mouth (orally).  Medicine that is applied as a cream (topically).  Medicine that is inserted directly into the vagina (suppository). Follow these instructions at home: Lifestyle  Do not have sex until your health care provider approves. Tell your sex partner that you have a yeast infection. That person should go to his or her health care provider and ask if they should also be treated.  Do not wear tight clothes, such as pantyhose or tight pants.  Wear breathable cotton underwear. General instructions  Take or apply over-the-counter and prescription medicines only as told by your health care provider.  Eat more yogurt. This may help to keep your  yeast infection from returning.  Do not use tampons until your health care provider approves.  Try taking a sitz bath to help with discomfort. This is a warm water bath that is taken while you are sitting down. The water should only come up to your hips and should cover your buttocks. Do this 3-4 times per day or as told by your health care provider.  Do not douche.  If you have diabetes, keep your blood sugar levels under control.  Keep all follow-up visits as told by your health care provider. This is important.   Contact a health care provider if:  You have a fever.  Your symptoms go away and  then return.  Your symptoms do not get better with treatment.  Your symptoms get worse.  You have new symptoms.  You develop blisters in or around your vagina.  You have blood coming from your vagina and it is not your menstrual period.  You develop pain in your abdomen. Summary  Vaginal yeast infection is a condition that causes discharge as well as soreness, swelling, and redness (inflammation) of the vagina.  This condition is treated with medicine. Medicines may be over-the-counter or prescription.  Take or apply over-the-counter and prescription medicines only as told by your health care provider.  Do not douche. Do not have sex or use tampons until your health care provider approves.  Contact a health care provider if your symptoms do not get better with treatment or your symptoms go away and then return. This information is not intended to replace advice given to you by your health care provider. Make sure you discuss any questions you have with your health care provider. Document Revised: 05/11/2019 Document Reviewed: 02/27/2018 Elsevier Patient Education  [place].

## 2021-02-18 NOTE — Progress Notes (Signed)
Acute Office Visit  Subjective:    Patient ID: Jill Mays, female    DOB: Jun 07, 1971, 50 y.o.   MRN: [id number]  Chief Complaint  Patient presents with  . Vaginitis    Pt c/o possible yeast infection. Pt has taken 3 day monostat and tried boric acid suppositories last Wednesday.     HPI Patient is in today for for vaginal irritation. Denies any abnormal discharge.   She has not had intercourse in years and reports partner  did oral sex on her and she has had irritation ever since and felt very sore.   She has tried three day monistat and boric acid suppository.  She had sex again and has pain on skin and vagina.   She feels she has a small vaginal tear she could see in mirror, denies any burning or paresthesias.  Denies any abuse or trauma. Denies any bleeding She does have any known exposures.  She has had new sexual partner.   Patient  denies any fever, body aches,chills, rash, chest pain, shortness of breath, nausea, vomiting, or diarrhea.  Denies dizziness, lightheadedness, pre syncopal or syncopal episodes.    Past Medical History:  Diagnosis Date  . Anxiety   . breast cancer    s/p bilateral mastectomy TAH/BSO  . Cellulitis due to methicillin-resistant Staphylococcus aureus (MRSA) May 2012   of breast (post reconstruction) presumed Strep or Staph  . Depression   . 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 Thyroid disease Oct 2012   elevated TSH per patient     Past Surgical History:  Procedure Laterality Date  . ABDOMINAL HYSTERECTOMY    . BILATERAL OOPHORECTOMY    . BREAST SURGERY      Family History  Problem Relation Age of Onset  . Cancer Mother 109       with recurrence, died in her 56s due to dementia  . Hypertension Father   . Alcohol abuse Father   . Heart disease Father 61  . Cancer Maternal Aunt 65       pancreatic CA  . Cancer Maternal Grandmother     Social History    Socioeconomic History  . Marital status: Single    Spouse name: Not on file  . Number of children: 1  . Years of education: Not on file  . Highest education level: Bachelor's degree (e.g., BA, AB, BS)  Occupational History  . Occupation: registered Hydrographic surveyor: [employer]  Tobacco Use  . Smoking status: Never Smoker  . Smokeless tobacco: Never Used  Substance and Sexual Activity  . Alcohol use: Yes    Alcohol/week: 5.0 standard drinks    Types: 5 Shots of liquor per week  . Drug use: No  . Sexual activity: Not on file  Other Topics Concern  . Not on file  Social History Narrative   single mother.  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 )    Social Determinants of Health   Financial Resource Strain: Not on file  Food Insecurity: Not on file  Transportation Needs: Not on file  Physical Activity: Not on file  Stress: Not on file  Social Connections: Not on file  Intimate Partner Violence: Not on file    Outpatient Medications Prior to Visit  Medication Sig Dispense Refill  . Ascorbic Acid (VITAMIN C) 100 MG tablet Take 500 mg by mouth 2 (two) times  daily.    . Calcium Carbonate-Vitamin D 600-200 MG-UNIT TABS Take by mouth.    . cetirizine (ZYRTEC) 10 MG tablet Take 2 tablets in AM And 2 tablets in PM for hives    . clobetasol (TEMOVATE) 0.05 % external solution Apply topically.    Marland Kitchen doxylamine, Sleep, (UNISOM) 25 MG tablet Take 25 mg by mouth at bedtime as needed.    Marland Kitchen EPINEPHrine (EPIPEN) 0.3 mg/0.3 mL DEVI Inject 0.3 mLs (0.3 mg total) into the muscle once. 1 Device 1  . estradiol (ESTRACE) 1 MG tablet Take 1 tablet (1 mg total) by mouth daily. 90 tablet 0  . FLUoxetine (PROZAC) 10 MG capsule TAKE 1 CAPSULE BY MOUTH EVERY DAY 90 capsule 0  . gabapentin (NEURONTIN) 100 MG capsule Take 1 capsule (100 mg total) by mouth 3 (three) times daily. 90 capsule 3  . hydroxychloroquine (PLAQUENIL) 200 MG tablet Take by mouth.    Marland Kitchen Lifitegrast (XIIDRA) 5 % SOLN  Administer 1 drop to both eyes Two (2) times a day.    . montelukast (SINGULAIR) 10 MG tablet Take 1 tablet by mouth nightly to help treat hives    . Multiple Vitamin (MULTIVITAMIN) tablet Take 1 tablet by mouth daily.    Marland Kitchen triamcinolone cream (KENALOG) 0.5 % APPLY TOPICALLY TWO (2) TIMES A DAY FOR 10 DAYS.     No facility-administered medications prior to visit.    Allergies  Allergen Reactions  . Amoxicillin   . Azithromycin   . Cephalexin   . Septra [Bactrim]     Review of Systems  Constitutional: Negative.   Respiratory: Negative.   Cardiovascular: Negative.   Gastrointestinal: Negative.   Genitourinary: Positive for vaginal discharge.  Musculoskeletal: Negative.   Neurological: Negative.   Psychiatric/Behavioral: Negative.        Objective:    Physical Exam Vitals and nursing note reviewed.  Constitutional:      Appearance: Normal appearance. She is not ill-appearing.  HENT:     Head: Normocephalic and atraumatic.     Right Ear: External ear normal.     Left Ear: External ear normal.     Nose: Nose normal.     Mouth/Throat:     Mouth: Mucous membranes are moist.  Eyes:     General: No scleral icterus.       Right eye: No discharge.        Left eye: No discharge.     Conjunctiva/sclera: Conjunctivae normal.     Pupils: Pupils are equal, round, and reactive to light.  Cardiovascular:     Rate and Rhythm: Normal rate and regular rhythm.     Pulses: Normal pulses.     Heart sounds: Normal heart sounds. No murmur heard. No friction rub. No gallop.   Pulmonary:     Effort: Pulmonary effort is normal. No respiratory distress.     Breath sounds: Normal breath sounds. No stridor. No wheezing, rhonchi or rales.  Chest:     Chest wall: No tenderness.  Abdominal:     General: Bowel sounds are normal. There is no distension.     Palpations: Abdomen is soft.     Tenderness: There is no abdominal tenderness. There is no guarding.  Genitourinary:    Labia:         Left: Tenderness present.        Comments: Mild erythema to bilateral labia's and skin/ groin folds.  Musculoskeletal:        General: No tenderness. Normal range of  motion.     Cervical back: Normal range of motion and neck supple.     Right lower leg: No edema.     Left lower leg: No edema.  Skin:    General: Skin is warm.     Findings: No erythema, lesion or rash.  Neurological:     Mental Status: She is alert and oriented to person, place, and time.     Motor: No weakness.     Gait: Gait normal.  Psychiatric:        Mood and Affect: Mood normal.        Behavior: Behavior normal.        Thought Content: Thought content normal.        Judgment: Judgment normal.     BP 120/64 (BP Location: Left Arm, Patient Position: Sitting)   Pulse 74   Temp 97.8 F (36.6 C)   Ht 5' 2.52" (1.588 m)   Wt 113 lb 6.4 oz (51.4 kg)   SpO2 99%   BMI 20.40 kg/m  Wt Readings from Last 3 Encounters:  02/18/21 113 lb 6.4 oz (51.4 kg)  12/17/20 112 lb (50.8 kg)  11/28/19 115 lb (52.2 kg)    Health Maintenance Due  Topic Date Due  . COLONOSCOPY (Pts 45-85yrs Insurance coverage will need to be confirmed)  Never done  . MAMMOGRAM  10/02/2019  . COVID-19 Vaccine (3 - Pfizer risk 4-dose series) 12/14/2019  . TETANUS/TDAP  10/26/2020    There are no preventive care reminders to display for this patient.   Lab Results  Component Value Date   TSH 2.11 08/31/2018   Lab Results  Component Value Date   WBC 6.3 12/17/2020   HGB 13.4 12/17/2020   HCT 39.4 12/17/2020   MCV 88.9 12/17/2020   PLT 240.0 12/17/2020   Lab Results  Component Value Date   NA 137 12/17/2020   K 4.0 12/17/2020   CO2 29 12/17/2020   GLUCOSE 80 12/17/2020   BUN 25 (H) 12/17/2020   CREATININE 1.04 12/17/2020   BILITOT 0.4 12/17/2020   ALKPHOS 81 12/17/2020   AST 21 12/17/2020   ALT 20 12/17/2020   PROT 7.2 12/17/2020   ALBUMIN 4.4 12/17/2020   CALCIUM 10.3 12/17/2020   GFR 62.36 12/17/2020   Lab Results   Component Value Date   CHOL 194 12/17/2020   Lab Results  Component Value Date   HDL 73.60 12/17/2020   Lab Results  Component Value Date   LDLCALC 102 (H) 12/17/2020   Lab Results  Component Value Date   TRIG 96.0 12/17/2020   Lab Results  Component Value Date   CHOLHDL 3 12/17/2020   No results found for: HGBA1C     Assessment & Plan:   Problem List Items Addressed This Visit      Genitourinary   Abrasion of vagina   Relevant Orders   HSV(herpes simplex vrs) 1+2 ab-IgM     Other   Screen for STD (sexually transmitted disease)   Vaginal discharge   Relevant Orders   NuSwab Vaginitis Plus (VG+)   Yeast infection - Primary   Relevant Medications   fluconazole (DIFLUCAN) 150 MG tablet     Meds ordered this encounter  Medications  . fluconazole (DIFLUCAN) 150 MG tablet    Sig: Take 1 tablet (150 mg total) by mouth as directed. Take one tablet by mouth on day 1. May repeat dose of one tablet by mouth on day four.    Dispense:  2 tablet    Refill:  0   Appearance of yeast like discharge she denies any vaginal odor. Appears to be a small skin tear/ abrasion as noted in note. Ok to aply ice and vaginal moisturizer. Did discuss with patient the possibility of the start of an HSV lesion and when t return and symptoms of HSV.  Can check IGM HSV.  Recommend STD screening by blood- patient declines at this time.   Orders Placed This Encounter  Procedures  . NuSwab Vaginitis Plus (VG+)  . HSV(herpes simplex vrs) 1+2 ab-IgM    Standing Status:   Future    Standing Expiration Date:   02/18/2022  vaginal discharge sent for Nuswab plus.  Will give Diflucan as above.   Safe sex advised.  Patient verbalized understanding of all instructions given and denies any further questions at this time.      Meds ordered this encounter  Medications  . fluconazole (DIFLUCAN) 150 MG tablet    Sig: Take 1 tablet (150 mg total) by mouth as directed. Take one tablet by mouth on  day 1. May repeat dose of one tablet by mouth on day four.    Dispense:  2 tablet    Refill:  0     Marcille Buffy, FNP

## 2021-02-21 LAB — NUSWAB VAGINITIS PLUS (VG+)
Atopobium vaginae: HIGH Score — AB
Candida albicans, NAA: NEGATIVE
Candida glabrata, NAA: NEGATIVE
Chlamydia trachomatis, NAA: NEGATIVE
Neisseria gonorrhoeae, NAA: NEGATIVE
Trich vag by NAA: NEGATIVE

## 2021-02-23 ENCOUNTER — Other Ambulatory Visit: Payer: Self-pay | Admitting: Adult Health

## 2021-02-23 DIAGNOSIS — N760 Acute vaginitis: Secondary | ICD-10-CM

## 2021-02-23 MED ORDER — METRONIDAZOLE 500 MG PO TABS
500.0000 mg | ORAL_TABLET | Freq: Two times a day (BID) | ORAL | 0 refills | Status: AC
Start: 2021-02-23 — End: 2021-03-02

## 2021-02-23 NOTE — Progress Notes (Signed)
Meds ordered this encounter  Medications  . metroNIDAZOLE (FLAGYL) 500 MG tablet    Sig: Take 1 tablet (500 mg total) by mouth 2 (two) times daily for 7 days.    Dispense:  14 tablet    Refill:  0    Bacterial vaginosis - Plan: metroNIDAZOLE (FLAGYL) 500 MG tablet '

## 2021-02-23 NOTE — Progress Notes (Signed)
Positive Bacterial Vaginosis. Will send in FLAGYL to take as directed, do not drink any alcohol while on the medication and at least one week following treatment.   Meds ordered this encounter Medications  metroNIDAZOLE (FLAGYL) 500 MG tablet   Sig: Take 1 tablet (500 mg total) by mouth 2 (two) times daily for 7 days.   Dispense:  14 tablet   Refill:  0

## 2021-02-24 ENCOUNTER — Other Ambulatory Visit: Payer: Self-pay | Admitting: Internal Medicine

## 2021-02-24 DIAGNOSIS — E8940 Asymptomatic postprocedural ovarian failure: Secondary | ICD-10-CM

## 2021-03-12 ENCOUNTER — Other Ambulatory Visit: Payer: Self-pay | Admitting: Internal Medicine

## 2021-04-21 ENCOUNTER — Encounter: Payer: Self-pay | Admitting: Adult Health

## 2021-04-21 MED ORDER — FLUCONAZOLE 150 MG PO TABS: 150.0000 mg | ORAL_TABLET | Freq: Every day | ORAL | 0 refills | Status: DC

## 2021-04-21 NOTE — Telephone Encounter (Signed)
This message was sent to me to prescribe diflucan.  In reviewing, it appears that she was seen 02/18/21 with vaginal symptoms and again in May - for urinary symptoms.  Looks like she needs to be evaluated to confirm etiology of symptoms.

## 2021-06-24 ENCOUNTER — Other Ambulatory Visit: Payer: Self-pay | Admitting: Internal Medicine

## 2021-06-24 DIAGNOSIS — E8940 Asymptomatic postprocedural ovarian failure: Secondary | ICD-10-CM

## 2021-09-21 ENCOUNTER — Other Ambulatory Visit: Payer: Self-pay | Admitting: Internal Medicine

## 2021-09-21 DIAGNOSIS — E8940 Asymptomatic postprocedural ovarian failure: Secondary | ICD-10-CM

## 2021-10-08 ENCOUNTER — Other Ambulatory Visit: Payer: Self-pay | Admitting: Internal Medicine

## 2021-12-28 ENCOUNTER — Other Ambulatory Visit: Payer: Self-pay | Admitting: Internal Medicine

## 2021-12-28 DIAGNOSIS — E8940 Asymptomatic postprocedural ovarian failure: Secondary | ICD-10-CM

## 2022-01-08 ENCOUNTER — Other Ambulatory Visit: Payer: Self-pay | Admitting: Internal Medicine

## 2022-01-13 ENCOUNTER — Ambulatory Visit (INDEPENDENT_AMBULATORY_CARE_PROVIDER_SITE_OTHER): Payer: BC Managed Care – PPO | Admitting: Internal Medicine

## 2022-01-13 ENCOUNTER — Other Ambulatory Visit: Payer: Self-pay

## 2022-01-13 ENCOUNTER — Encounter: Payer: Self-pay | Admitting: Internal Medicine

## 2022-01-13 VITALS — BP 98/60 | HR 68 | Temp 98.0°F | Ht 62.0 in | Wt 112.6 lb

## 2022-01-13 DIAGNOSIS — L509 Urticaria, unspecified: Secondary | ICD-10-CM | POA: Diagnosis not present

## 2022-01-13 DIAGNOSIS — B3731 Acute candidiasis of vulva and vagina: Secondary | ICD-10-CM

## 2022-01-13 DIAGNOSIS — R7301 Impaired fasting glucose: Secondary | ICD-10-CM | POA: Diagnosis not present

## 2022-01-13 DIAGNOSIS — R5383 Other fatigue: Secondary | ICD-10-CM | POA: Diagnosis not present

## 2022-01-13 DIAGNOSIS — Z0000 Encounter for general adult medical examination without abnormal findings: Secondary | ICD-10-CM

## 2022-01-13 DIAGNOSIS — E785 Hyperlipidemia, unspecified: Secondary | ICD-10-CM | POA: Diagnosis not present

## 2022-01-13 MED ORDER — GABAPENTIN 100 MG PO CAPS: 100.0000 mg | ORAL_CAPSULE | Freq: Four times a day (QID) | ORAL | 11 refills | Status: DC

## 2022-01-13 MED ORDER — HYDROXYZINE PAMOATE 25 MG PO CAPS: 25.0000 mg | ORAL_CAPSULE | Freq: Three times a day (TID) | ORAL | 2 refills | Status: DC | PRN

## 2022-01-13 MED ORDER — FLUCONAZOLE 200 MG PO TABS: 200.0000 mg | ORAL_TABLET | ORAL | 1 refills | Status: DC

## 2022-01-13 NOTE — Patient Instructions (Signed)
Resume gabapentin up to 4 daily for itching ? ?Hydroxyzine also refilled ? ? ?Fluconazole refilled for future use  200 mg daily x 2 days  ? ?Let me knwo which GI you want your referral to  ?

## 2022-01-13 NOTE — Progress Notes (Signed)
The patient is here for annual preventive examination and management of other chronic and acute problems. ? ?This visit occurred during the SARS-CoV-2 public health emergency.  Safety protocols were in place, including screening questions prior to the visit, additional usage of staff PPE, and extensive cleaning of exam room while observing appropriate contact time as indicated for disinfecting solutions.  ? ?  ?The risk factors are reflected in the social history. ?  ?The roster of all physicians providing medical care to patient - is listed in the Snapshot section of the chart. ?  ?Activities of daily living:  The patient is 100% independent in all ADLs: dressing, toileting, feeding as well as independent mobility ?  ?Home safety : The patient has smoke detectors in the home. They wear seatbelts.  There are no unsecured firearms at home. There is no violence in the home.  ?  ?There is no risks for hepatitis, STDs or HIV. There is no   history of blood transfusion. They have no travel history to infectious disease endemic areas of the world. ?  ?The patient has seen their dentist in the last six month. They have seen their eye doctor in the last year. The patient  denies slight hearing difficulty with regard to whispered voices and some television programs. .  They do not  have excessive sun exposure. Discussed the need for sun protection: hats, long sleeves and use of sunscreen if there is significant sun exposure.  ?  ?Diet: the importance of a healthy diet is discussed. They do have a healthy diet. ?  ?The benefits of regular aerobic exercise were discussed. The patient  exercises  3 to 5 days per week  for  60 minutes.  ?  ?Depression screen: there are no signs or vegative symptoms of depression- irritability, change in appetite, anhedonia, sadness/tearfullness. ?  ?The following portions of the patient's history were reviewed and updated as appropriate: allergies, current medications, past family history, past  medical history,  past surgical history, past social history  and problem list. ?  ?Visual acuity was not assessed per patient preference since the patient has regular follow up with an  ophthalmologist. Hearing and body mass index were assessed and reviewed.  ?  ?During the course of the visit the patient was educated and counseled about appropriate screening and preventive services including : fall prevention , diabetes screening, nutrition counseling, colorectal cancer screening, and recommended immunizations.   ? ?Chief Complaint: ? ?1) persistent hives for the past 2 years . Symptoms improved after starting Zolair monthly injections, and for the past 6 months she was able to stop the daily antihistamines  However she has had a flare for the past month after delaying her zolair injection by 1 week .  She has resumed use of zyrtec and famotidine .  Still getting xolair monthly,  placquenil 100 mg daily  ? ?2) Sexually active in a monogamous relationship after many years of celibacy.  Recent treatment of vaginal abrasion reviewed.  ?  ? ? ?Review of Symptoms ? ?Patient denies headache, fevers, malaise, unintentional weight loss, skin rash, eye pain, sinus congestion and sinus pain, sore throat, dysphagia,  hemoptysis , cough, dyspnea, wheezing, chest pain, palpitations, orthopnea, edema, abdominal pain, nausea, melena, diarrhea, constipation, flank pain, dysuria, hematuria, urinary  Frequency, nocturia, numbness, tingling, seizures,  Focal weakness, Loss of consciousness,  Tremor, insomnia, depression, anxiety, and suicidal ideation.   ? ?Physical Exam: ? ?BP 98/60 (BP Location: Left Arm, Patient Position: Sitting,  Cuff Size: Small)   Pulse 68   Temp 98 ?F (36.7 ?C) (Oral)   Ht '5\' 2"'$  (1.575 m)   Wt 112 lb 9.6 oz (51.1 kg)   SpO2 99%   BMI 20.59 kg/m?   ? ?General appearance: alert, cooperative and appears stated age ?Ears: normal TM's and external ear canals both ears ?Throat: lips, mucosa, and tongue  normal; teeth and gums normal ?Neck: no adenopathy, no carotid bruit, supple, symmetrical, trachea midline and thyroid not enlarged, symmetric, no tenderness/mass/nodules ?Back: symmetric, no curvature. ROM normal. No CVA tenderness. ?Lungs: clear to auscultation bilaterally ?Heart: regular rate and rhythm, S1, S2 normal, no murmur, click, rub or gallop ?Abdomen: soft, non-tender; bowel sounds normal; no masses,  no organomegaly ?Pulses: 2+ and symmetric ?Skin: Skin color, texture, turgor normal. No rashes or lesions ?Lymph nodes: Cervical, supraclavicular, and axillary nodes normal.  ? ?Assessment and Plan: ? ?Hives ?Recent episode precipitated by taking Xolair injection one week late.  Has resumed antihistamin e bid and famotidine.  Hives currenlty not palpable bu urticaria is deep and intense.  Resume gabapentin  ? ?Vaginitis due to Candida ?Fluconazole refilled for prn use  ? ?Encounter for preventive health examination ?age appropriate education and counseling updated, referrals for preventative services and immunizations addressed, dietary and smoking counseling addressed, most recent labs reviewed.  I have personally reviewed and have noted: ?  ?1) the patient's medical and social history ?2) The pt's use of alcohol, tobacco, and illicit drugs ?3) The patient's current medications and supplements ?4) Functional ability including ADL's, fall risk, home safety risk, hearing and visual impairment ?5) Diet and physical activities ?6) Evidence for depression or mood disorder ?7) The patient's height, weight, and BMI have been recorded in the chart ?  ?I have made referrals, and provided counseling and education based on review of the above ? ? ?Updated Medication List ?Outpatient Encounter Medications as of 01/13/2022  ?Medication Sig  ? Ascorbic Acid (VITAMIN C) 100 MG tablet Take 500 mg by mouth 2 (two) times daily.  ? Calcium Carbonate-Vitamin D 600-200 MG-UNIT TABS Take by mouth.  ? cetirizine (ZYRTEC) 10 MG  tablet Take 2 tablets in AM And 2 tablets in PM for hives  ? doxylamine, Sleep, (UNISOM) 25 MG tablet Take 25 mg by mouth at bedtime as needed.  ? EPINEPHrine (EPIPEN) 0.3 mg/0.3 mL DEVI Inject 0.3 mLs (0.3 mg total) into the muscle once.  ? estradiol (ESTRACE) 1 MG tablet TAKE 1 TABLET BY MOUTH EVERY DAY  ? FLUoxetine (PROZAC) 10 MG capsule TAKE 1 CAPSULE BY MOUTH EVERY DAY  ? hydrOXYzine (VISTARIL) 25 MG capsule Take 1 capsule (25 mg total) by mouth 3 (three) times daily as needed.  ? Multiple Vitamin (MULTIVITAMIN) tablet Take 1 tablet by mouth daily.  ? [DISCONTINUED] estrogens, conjugated, (PREMARIN) 0.625 MG tablet TAKE 1 TABLET (0.625 MG TOTAL) BY MOUTH DAILY.  ? [DISCONTINUED] gabapentin (NEURONTIN) 100 MG capsule Take 1 capsule (100 mg total) by mouth 3 (three) times daily.  ? fluconazole (DIFLUCAN) 200 MG tablet Take 1 tablet (200 mg total) by mouth as directed.  ? gabapentin (NEURONTIN) 100 MG capsule Take 1 capsule (100 mg total) by mouth 4 (four) times daily.  ? montelukast (SINGULAIR) 10 MG tablet Take 1 tablet by mouth nightly to help treat hives (Patient not taking: Reported on 01/13/2022)  ? triamcinolone cream (KENALOG) 0.5 % APPLY TOPICALLY TWO (2) TIMES A DAY FOR 10 DAYS. (Patient not taking: Reported on 01/13/2022)  ? [DISCONTINUED]  fluconazole (DIFLUCAN) 150 MG tablet Take 1 tablet (150 mg total) by mouth as directed. Take one tablet by mouth on day 1. May repeat dose of one tablet by mouth on day four. (Patient not taking: Reported on 01/13/2022)  ? [DISCONTINUED] fluconazole (DIFLUCAN) 150 MG tablet Take 1 tablet (150 mg total) by mouth daily.  ? ?No facility-administered encounter medications on file as of 01/13/2022.  ?  ?

## 2022-01-13 NOTE — Assessment & Plan Note (Signed)
Recent episode precipitated by taking Xolair injection one week late.  Has resumed antihistamin e bid and famotidine.  Hives currenlty not palpable bu urticaria is deep and intense.  Resume gabapentin  ?

## 2022-01-14 NOTE — Assessment & Plan Note (Signed)
Fluconazole refilled for prn use  ?

## 2022-01-14 NOTE — Assessment & Plan Note (Signed)

## 2022-02-04 ENCOUNTER — Other Ambulatory Visit: Payer: Self-pay | Admitting: Internal Medicine

## 2022-04-10 ENCOUNTER — Other Ambulatory Visit: Payer: Self-pay | Admitting: Family

## 2022-04-10 ENCOUNTER — Other Ambulatory Visit: Payer: Self-pay | Admitting: Internal Medicine

## 2022-04-10 DIAGNOSIS — E8940 Asymptomatic postprocedural ovarian failure: Secondary | ICD-10-CM

## 2022-04-21 ENCOUNTER — Other Ambulatory Visit: Payer: Self-pay | Admitting: Internal Medicine

## 2022-04-21 DIAGNOSIS — E8940 Asymptomatic postprocedural ovarian failure: Secondary | ICD-10-CM

## 2022-04-21 MED ORDER — ESTRADIOL 1 MG PO TABS: 1.0000 mg | ORAL_TABLET | Freq: Every day | ORAL | 0 refills | Status: DC

## 2022-07-20 ENCOUNTER — Other Ambulatory Visit: Payer: Self-pay | Admitting: Family

## 2022-07-20 ENCOUNTER — Other Ambulatory Visit: Payer: Self-pay | Admitting: Internal Medicine

## 2022-07-20 DIAGNOSIS — E8940 Asymptomatic postprocedural ovarian failure: Secondary | ICD-10-CM

## 2022-11-07 ENCOUNTER — Other Ambulatory Visit: Payer: Self-pay | Admitting: Internal Medicine

## 2022-11-07 DIAGNOSIS — E8940 Asymptomatic postprocedural ovarian failure: Secondary | ICD-10-CM

## 2022-11-18 LAB — HM COLONOSCOPY

## 2022-12-09 ENCOUNTER — Other Ambulatory Visit: Payer: Self-pay | Admitting: Internal Medicine

## 2022-12-09 MED ORDER — FLUOXETINE HCL 10 MG PO CAPS: 10.0000 mg | ORAL_CAPSULE | Freq: Every day | ORAL | 0 refills | Status: DC

## 2022-12-31 ENCOUNTER — Other Ambulatory Visit: Payer: Self-pay | Admitting: Internal Medicine

## 2023-01-04 ENCOUNTER — Other Ambulatory Visit: Payer: Self-pay | Admitting: Internal Medicine

## 2023-01-04 NOTE — Telephone Encounter (Signed)
My chart msg was sent informing pt to call the office to get scheduled for further refills as her last office visit was 01/13/2022

## 2023-01-10 ENCOUNTER — Ambulatory Visit (INDEPENDENT_AMBULATORY_CARE_PROVIDER_SITE_OTHER): Payer: BC Managed Care – PPO | Admitting: Internal Medicine

## 2023-01-10 ENCOUNTER — Encounter: Payer: Self-pay | Admitting: Internal Medicine

## 2023-01-10 DIAGNOSIS — E8940 Asymptomatic postprocedural ovarian failure: Secondary | ICD-10-CM | POA: Diagnosis not present

## 2023-01-10 DIAGNOSIS — E785 Hyperlipidemia, unspecified: Secondary | ICD-10-CM | POA: Diagnosis not present

## 2023-01-10 DIAGNOSIS — F411 Generalized anxiety disorder: Secondary | ICD-10-CM

## 2023-01-10 DIAGNOSIS — R7301 Impaired fasting glucose: Secondary | ICD-10-CM

## 2023-01-10 DIAGNOSIS — E8941 Symptomatic postprocedural ovarian failure: Secondary | ICD-10-CM

## 2023-01-10 DIAGNOSIS — Z0000 Encounter for general adult medical examination without abnormal findings: Secondary | ICD-10-CM

## 2023-01-10 DIAGNOSIS — M8580 Other specified disorders of bone density and structure, unspecified site: Secondary | ICD-10-CM | POA: Diagnosis not present

## 2023-01-10 DIAGNOSIS — R5383 Other fatigue: Secondary | ICD-10-CM

## 2023-01-10 DIAGNOSIS — Z9013 Acquired absence of bilateral breasts and nipples: Secondary | ICD-10-CM

## 2023-01-10 DIAGNOSIS — Z23 Encounter for immunization: Secondary | ICD-10-CM | POA: Diagnosis not present

## 2023-01-10 DIAGNOSIS — F5101 Primary insomnia: Secondary | ICD-10-CM

## 2023-01-10 DIAGNOSIS — G4700 Insomnia, unspecified: Secondary | ICD-10-CM | POA: Insufficient documentation

## 2023-01-10 MED ORDER — ZOSTER VAC RECOMB ADJUVANTED 50 MCG/0.5ML IM SUSR: 0.5000 mL | Freq: Once | INTRAMUSCULAR | 1 refills | Status: AC

## 2023-01-10 MED ORDER — GABAPENTIN 100 MG PO CAPS: 100.0000 mg | ORAL_CAPSULE | Freq: Every day | ORAL | 3 refills | Status: DC

## 2023-01-10 MED ORDER — FLUOXETINE HCL 10 MG PO CAPS: ORAL_CAPSULE | ORAL | 0 refills | Status: DC

## 2023-01-10 MED ORDER — ESTRADIOL 1 MG PO TABS: 1.0000 mg | ORAL_TABLET | Freq: Every day | ORAL | 0 refills | Status: DC

## 2023-01-10 NOTE — Progress Notes (Signed)
Patient ID: Jill Mays, female    DOB: [birth year]  Age: 52 y.o. MRN: [id number]  The patient is here for annual preventive examination and management of other chronic and acute problems.   The risk factors are reflected in the social history.   The roster of all physicians providing medical care to patient - is listed in the Snapshot section of the chart.   Activities of daily living:  The patient is 100% independent in all ADLs: dressing, toileting, feeding as well as independent mobility   Home safety : The patient has smoke detectors in the home. They wear seatbelts.  There are no unsecured firearms at home. There is no violence in the home.    There is no risks for hepatitis, STDs or HIV. There is no   history of blood transfusion. They have no travel history to infectious disease endemic areas of the world.   The patient has seen their dentist in the last six month. They have seen their eye doctor in the last year. The patinet  denies slight hearing difficulty with regard to whispered voices and some television programs.  They have deferred audiologic testing in the last year.  They do not  have excessive sun exposure. Discussed the need for sun protection: hats, long sleeves and use of sunscreen if there is significant sun exposure.    Diet: the importance of a healthy diet is discussed. They do have a healthy diet.   The benefits of regular aerobic exercise were discussed. The patient  exercises  3 to 5 days per week  for  60 minutes.    Depression screen: there are no signs or vegative symptoms of depression- irritability, change in appetite, anhedonia, sadness/tearfullness.   The following portions of the patient's history were reviewed and updated as appropriate: allergies, current medications, past family history, past medical history,  past surgical history, past social history  and problem list.   Visual acuity was not assessed per patient preference since the patient has  regular follow up with an  ophthalmologist. Hearing and body mass index were assessed and reviewed.    During the course of the visit the patient was educated and counseled about appropriate screening and preventive services including : fall prevention , diabetes screening, nutrition counseling, colorectal cancer screening, and recommended immunizations.    Chief Complaint:  HPI     Annual Exam    Additional comments: Physical       Last edited by Adair Laundry, [place] on 01/10/2023  2:41 PM.        Review of Symptoms  Patient denies headache, fevers, malaise, unintentional weight loss, skin rash, eye pain, sinus congestion and sinus pain, sore throat, dysphagia,  hemoptysis , cough, dyspnea, wheezing, chest pain, palpitations, orthopnea, edema, abdominal pain, nausea, melena, diarrhea, constipation, flank pain, dysuria, hematuria, urinary  Frequency, nocturia, numbness, tingling, seizures,  Focal weakness, Loss of consciousness,  Tremor, insomnia, depression, anxiety, and suicidal ideation.    Physical Exam:  There were no vitals taken for this visit.   Physical Exam Vitals reviewed.  Constitutional:      General: She is not in acute distress.    Appearance: Normal appearance. She is normal weight. She is not ill-appearing, toxic-appearing or diaphoretic.  HENT:     Head: Normocephalic.  Eyes:     General: No scleral icterus.       Right eye: No discharge.        Left eye: No discharge.  Conjunctiva/sclera: Conjunctivae normal.  Cardiovascular:     Rate and Rhythm: Normal rate and regular rhythm.     Heart sounds: Normal heart sounds.  Pulmonary:     Effort: Pulmonary effort is normal. No respiratory distress.     Breath sounds: Normal breath sounds.  Musculoskeletal:        General: Normal range of motion.  Skin:    General: Skin is warm and dry.  Neurological:     General: No focal deficit present.     Mental Status: She is alert and oriented to person, place,  and time. Mental status is at baseline.  Psychiatric:        Mood and Affect: Mood normal.        Behavior: Behavior normal.        Thought Content: Thought content normal.        Judgment: Judgment normal.     Assessment and Plan: Osteopenia due to cancer therapy Assessment & Plan: Will repeat in 2026.  Reviewed calcium needs    Postsurgical menopause -     Estradiol; Take 1 tablet (1 mg total) by mouth daily.  Dispense: 90 tablet; Refill: 0  Need for DTP, Hib conjugate and hepatitis B vaccine  Need for hepatitis A and B vaccination -     Hepatitis A antibody, total -     Hepatitis B surface antibody,qualitative  Fatigue, unspecified type -     TSH -     CBC with Differential/Platelet  Hyperlipidemia with target LDL less than 160 -     Lipid panel -     LDL cholesterol, direct  Impaired fasting glucose -     Hemoglobin A1c -     Comprehensive metabolic panel  Need for Tdap vaccination -     Tdap vaccine greater than or equal to 7yo IM  Symptomatic postsurgical menopause Assessment & Plan: With osteoporosis and flushing, vaginal dryness as primary symptoms.  Estradiol prescribed.  She is s/p TAHBSO   S/P bilateral mastectomy Assessment & Plan: Prophylactic due to strong FH of BRCA .  S/p plastic surgery /reconstruction done in 2011 .   Encounter for preventive health examination Assessment & Plan: age appropriate education and counseling updated, referrals for preventative services and immunizations addressed, dietary and smoking counseling addressed, most recent labs reviewed.  I have personally reviewed and have noted:   1) the patient's medical and social history 2) The pt's use of alcohol, tobacco, and illicit drugs 3) The patient's current medications and supplements 4) Functional ability including ADL's, fall risk, home safety risk, hearing and visual impairment 5) Diet and physical activities 6) Evidence for depression or mood disorder 7) The  patient's height, weight, and BMI have been recorded in the chart   I have made referrals, and provided counseling and education based on review of the above    Anxiety, generalized Assessment & Plan: Secondary to major life changes:  her son graduated from college and is living in [place] and she is planning to remarry in September .  Resume prozac.    Primary insomnia Assessment & Plan: Using low dose gabapentin without side effects.  rEfills given    Other orders -     Gabapentin; Take 1 capsule (100 mg total) by mouth at bedtime.  Dispense: 90 capsule; Refill: 3 -     FLUoxetine HCl; TAKE 1 CAPSULE (10 MG TOTAL) BY MOUTH every other day  Dispense: 45 capsule; Refill: 0 -     Zoster  Vac Recomb Adjuvanted; Inject 0.5 mLs into the muscle once for 1 dose.  Dispense: 1 each; Refill: 1    Return in about 1 year (around 01/10/2024).  Crecencio Mc, MD

## 2023-01-10 NOTE — Assessment & Plan Note (Signed)
With osteoporosis and flushing, vaginal dryness as primary symptoms.  Estradiol prescribed.  She is s/p TAHBSO

## 2023-01-10 NOTE — Patient Instructions (Signed)
Congratulations!   Checking Hep A and B vaccination titers today  You can get the shingrix vaccine any time you feel like feeling flu-ish for 2 days...Marland KitchenMarland KitchenMarland Kitchen  TdAP given today

## 2023-01-10 NOTE — Assessment & Plan Note (Signed)
Will repeat in 2026.  Reviewed calcium needs

## 2023-01-10 NOTE — Assessment & Plan Note (Signed)
Using low dose gabapentin without side effects.  rEfills given

## 2023-01-10 NOTE — Assessment & Plan Note (Addendum)
Prophylactic due to strong FH of BRCA .  S/p plastic surgery /reconstruction done in 2011 .

## 2023-01-10 NOTE — Assessment & Plan Note (Signed)
Secondary to major life changes:  her son graduated from college and is living in [place] and she is planning to remarry in September .  Resume prozac.

## 2023-01-10 NOTE — Assessment & Plan Note (Signed)

## 2023-01-11 LAB — LIPID PANEL
Cholesterol: 154 mg/dL (ref 0–200)
HDL: 61.2 mg/dL (ref >39.00)
LDL Cholesterol: 71 mg/dL (ref 0–99)
NonHDL: 92.37
Total CHOL/HDL Ratio: 3
Triglycerides: 109 mg/dL (ref 0.0–149.0)
VLDL: 21.8 mg/dL (ref 0.0–40.0)

## 2023-01-11 LAB — COMPREHENSIVE METABOLIC PANEL WITH GFR
ALT: 13 U/L (ref 0–35)
AST: 17 U/L (ref 0–37)
Albumin: 4.1 g/dL (ref 3.5–5.2)
Alkaline Phosphatase: 97 U/L (ref 39–117)
BUN: 16 mg/dL (ref 6–23)
CO2: 27 meq/L (ref 19–32)
Calcium: 9.5 mg/dL (ref 8.4–10.5)
Chloride: 102 meq/L (ref 96–112)
Creatinine, Ser: 1.03 mg/dL (ref 0.40–1.20)
GFR: 62.18 mL/min
Glucose, Bld: 86 mg/dL (ref 70–99)
Potassium: 3.9 meq/L (ref 3.5–5.1)
Sodium: 140 meq/L (ref 135–145)
Total Bilirubin: 0.3 mg/dL (ref 0.2–1.2)
Total Protein: 7 g/dL (ref 6.0–8.3)

## 2023-01-11 LAB — CBC WITH DIFFERENTIAL/PLATELET
Basophils Absolute: 0.1 10*3/uL (ref 0.0–0.1)
Basophils Relative: 0.9 % (ref 0.0–3.0)
Eosinophils Absolute: 0.3 10*3/uL (ref 0.0–0.7)
Eosinophils Relative: 5.9 % — ABNORMAL HIGH (ref 0.0–5.0)
HCT: 38.1 % (ref 36.0–46.0)
Hemoglobin: 12.9 g/dL (ref 12.0–15.0)
Lymphocytes Relative: 33.4 % (ref 12.0–46.0)
Lymphs Abs: 1.9 10*3/uL (ref 0.7–4.0)
MCHC: 33.9 g/dL (ref 30.0–36.0)
MCV: 90 fl (ref 78.0–100.0)
Monocytes Absolute: 0.4 10*3/uL (ref 0.1–1.0)
Monocytes Relative: 6.8 % (ref 3.0–12.0)
Neutro Abs: 3 10*3/uL (ref 1.4–7.7)
Neutrophils Relative %: 53 % (ref 43.0–77.0)
Platelets: 222 10*3/uL (ref 150.0–400.0)
RBC: 4.24 Mil/uL (ref 3.87–5.11)
RDW: 13 % (ref 11.5–15.5)
WBC: 5.7 10*3/uL (ref 4.0–10.5)

## 2023-01-11 LAB — HEMOGLOBIN A1C: Hgb A1c MFr Bld: 5.3 % (ref 4.6–6.5)

## 2023-01-11 LAB — LDL CHOLESTEROL, DIRECT: Direct LDL: 75 mg/dL

## 2023-01-11 LAB — TSH: TSH: 2.38 u[IU]/mL (ref 0.35–5.50)

## 2023-01-11 LAB — HEPATITIS B SURFACE ANTIBODY,QUALITATIVE: Hep B S Ab: REACTIVE — AB

## 2023-01-11 LAB — HEPATITIS A ANTIBODY, TOTAL: Hepatitis A AB,Total: NONREACTIVE

## 2023-05-07 ENCOUNTER — Other Ambulatory Visit: Payer: Self-pay | Admitting: Internal Medicine

## 2023-05-07 DIAGNOSIS — E8940 Asymptomatic postprocedural ovarian failure: Secondary | ICD-10-CM

## 2023-07-04 ENCOUNTER — Other Ambulatory Visit: Payer: Self-pay | Admitting: Internal Medicine

## 2023-08-15 ENCOUNTER — Other Ambulatory Visit: Payer: Self-pay | Admitting: Internal Medicine

## 2023-08-15 DIAGNOSIS — E8940 Asymptomatic postprocedural ovarian failure: Secondary | ICD-10-CM

## 2023-11-19 ENCOUNTER — Other Ambulatory Visit: Payer: Self-pay | Admitting: Internal Medicine

## 2023-11-19 DIAGNOSIS — E8940 Asymptomatic postprocedural ovarian failure: Secondary | ICD-10-CM

## 2023-12-16 ENCOUNTER — Other Ambulatory Visit: Payer: Self-pay | Admitting: Internal Medicine

## 2023-12-16 DIAGNOSIS — E8940 Asymptomatic postprocedural ovarian failure: Secondary | ICD-10-CM

## 2024-01-18 ENCOUNTER — Ambulatory Visit: Payer: Self-pay | Admitting: Internal Medicine

## 2024-01-18 ENCOUNTER — Encounter: Payer: Self-pay | Admitting: Internal Medicine

## 2024-01-18 VITALS — BP 102/66 | HR 56 | Ht 62.0 in | Wt 112.6 lb

## 2024-01-18 DIAGNOSIS — R5383 Other fatigue: Secondary | ICD-10-CM | POA: Diagnosis not present

## 2024-01-18 DIAGNOSIS — E785 Hyperlipidemia, unspecified: Secondary | ICD-10-CM | POA: Diagnosis not present

## 2024-01-18 DIAGNOSIS — Z0000 Encounter for general adult medical examination without abnormal findings: Secondary | ICD-10-CM

## 2024-01-18 DIAGNOSIS — R21 Rash and other nonspecific skin eruption: Secondary | ICD-10-CM | POA: Diagnosis not present

## 2024-01-18 DIAGNOSIS — F5101 Primary insomnia: Secondary | ICD-10-CM

## 2024-01-18 DIAGNOSIS — R7301 Impaired fasting glucose: Secondary | ICD-10-CM | POA: Diagnosis not present

## 2024-01-18 DIAGNOSIS — Z0001 Encounter for general adult medical examination with abnormal findings: Secondary | ICD-10-CM

## 2024-01-18 DIAGNOSIS — M8580 Other specified disorders of bone density and structure, unspecified site: Secondary | ICD-10-CM

## 2024-01-18 DIAGNOSIS — L509 Urticaria, unspecified: Secondary | ICD-10-CM | POA: Diagnosis not present

## 2024-01-18 DIAGNOSIS — M791 Myalgia, unspecified site: Secondary | ICD-10-CM | POA: Diagnosis not present

## 2024-01-18 DIAGNOSIS — R944 Abnormal results of kidney function studies: Secondary | ICD-10-CM

## 2024-01-18 DIAGNOSIS — G2581 Restless legs syndrome: Secondary | ICD-10-CM

## 2024-01-18 LAB — COMPREHENSIVE METABOLIC PANEL WITH GFR
ALT: 12 U/L (ref 0–35)
AST: 18 U/L (ref 0–37)
Albumin: 4.3 g/dL (ref 3.5–5.2)
Alkaline Phosphatase: 51 U/L (ref 39–117)
BUN: 17 mg/dL (ref 6–23)
CO2: 29 meq/L (ref 19–32)
Calcium: 9.7 mg/dL (ref 8.4–10.5)
Chloride: 103 meq/L (ref 96–112)
Creatinine, Ser: 1.18 mg/dL (ref 0.40–1.20)
GFR: 52.44 mL/min — ABNORMAL LOW (ref >60.00)
Glucose, Bld: 76 mg/dL (ref 70–99)
Potassium: 4.5 meq/L (ref 3.5–5.1)
Sodium: 139 meq/L (ref 135–145)
Total Bilirubin: 0.5 mg/dL (ref 0.2–1.2)
Total Protein: 6.8 g/dL (ref 6.0–8.3)

## 2024-01-18 LAB — CBC WITH DIFFERENTIAL/PLATELET
Basophils Absolute: 0.1 10*3/uL (ref 0.0–0.1)
Basophils Relative: 1.3 % (ref 0.0–3.0)
Eosinophils Absolute: 0.2 10*3/uL (ref 0.0–0.7)
Eosinophils Relative: 3.5 % (ref 0.0–5.0)
HCT: 39.5 % (ref 36.0–46.0)
Hemoglobin: 13.4 g/dL (ref 12.0–15.0)
Lymphocytes Relative: 40.3 % (ref 12.0–46.0)
Lymphs Abs: 1.8 10*3/uL (ref 0.7–4.0)
MCHC: 33.9 g/dL (ref 30.0–36.0)
MCV: 91.8 fl (ref 78.0–100.0)
Monocytes Absolute: 0.3 10*3/uL (ref 0.1–1.0)
Monocytes Relative: 7 % (ref 3.0–12.0)
Neutro Abs: 2.2 10*3/uL (ref 1.4–7.7)
Neutrophils Relative %: 47.9 % (ref 43.0–77.0)
Platelets: 242 10*3/uL (ref 150.0–400.0)
RBC: 4.3 Mil/uL (ref 3.87–5.11)
RDW: 12.4 % (ref 11.5–15.5)
WBC: 4.5 10*3/uL (ref 4.0–10.5)

## 2024-01-18 LAB — LIPID PANEL
Cholesterol: 180 mg/dL (ref 0–200)
HDL: 60.9 mg/dL (ref >39.00)
LDL Cholesterol: 101 mg/dL — ABNORMAL HIGH (ref 0–99)
NonHDL: 118.87
Total CHOL/HDL Ratio: 3
Triglycerides: 89 mg/dL (ref 0.0–149.0)
VLDL: 17.8 mg/dL (ref 0.0–40.0)

## 2024-01-18 LAB — SEDIMENTATION RATE: Sed Rate: 1 mm/h (ref 0–30)

## 2024-01-18 LAB — LDL CHOLESTEROL, DIRECT: Direct LDL: 101 mg/dL

## 2024-01-18 LAB — MAGNESIUM: Magnesium: 1.9 mg/dL (ref 1.5–2.5)

## 2024-01-18 LAB — HEMOGLOBIN A1C: Hgb A1c MFr Bld: 5.1 % (ref 4.6–6.5)

## 2024-01-18 LAB — C-REACTIVE PROTEIN: CRP: <1 mg/dL (ref 0.5–20.0)

## 2024-01-18 MED ORDER — PROMETHAZINE HCL 12.5 MG RE SUPP: 12.5000 mg | Freq: Four times a day (QID) | RECTAL | 0 refills | Status: AC | PRN

## 2024-01-18 MED ORDER — PREDNISONE 10 MG PO TABS: ORAL_TABLET | ORAL | 0 refills | Status: AC

## 2024-01-18 MED ORDER — ALPRAZOLAM 0.25 MG PO TABS: 0.2500 mg | ORAL_TABLET | Freq: Two times a day (BID) | ORAL | 0 refills | Status: AC | PRN

## 2024-01-18 MED ORDER — FLUOXETINE HCL 10 MG PO CAPS: 10.0000 mg | ORAL_CAPSULE | Freq: Every day | ORAL | 1 refills | Status: AC

## 2024-01-18 MED ORDER — ONDANSETRON 4 MG PO TBDP: 4.0000 mg | ORAL_TABLET | Freq: Three times a day (TID) | ORAL | 0 refills | Status: AC | PRN

## 2024-01-18 MED ORDER — SCOPOLAMINE 1 MG/3DAYS TD PT72: 1.0000 | MEDICATED_PATCH | TRANSDERMAL | 0 refills | Status: AC

## 2024-01-18 MED ORDER — GABAPENTIN 100 MG PO CAPS: 100.0000 mg | ORAL_CAPSULE | Freq: Every day | ORAL | 3 refills | Status: AC

## 2024-01-18 NOTE — Progress Notes (Unsigned)
 Patient ID: Jill Mays, female    DOB: 29-Apr-1971  Age: 53 y.o. MRN: [id number]  The patient is here for annual preventive examination and management of other chronic and acute problems.   The risk factors are reflected in the social history.   The roster of all physicians providing medical care to patient - is listed in the Snapshot section of the chart.   Activities of daily living:  The patient is 100% independent in all ADLs: dressing, toileting, feeding as well as independent mobility   Home safety : The patient has smoke detectors in the home. They wear seatbelts.  There are no unsecured firearms at home. There is no violence in the home.    There is no risks for hepatitis, STDs or HIV. There is no   history of blood transfusion. They have no travel history to infectious disease endemic areas of the world.   The patient has seen their dentist in the last six month. They have seen their eye doctor in the last year. The patinet  denies slight hearing difficulty with regard to whispered voices and some television programs.  They have deferred audiologic testing in the last year.  They do not  have excessive sun exposure. Discussed the need for sun protection: hats, long sleeves and use of sunscreen if there is significant sun exposure.    Diet: the importance of a healthy diet is discussed. They do have a healthy diet.   The benefits of regular aerobic exercise were discussed. The patient  exercises  3 to 5 days per week  for  60 minutes.    Depression screen: there are no signs or vegative symptoms of depression- irritability, change in appetite, anhedonia, sadness/tearfullness.   The following portions of the patient's history were reviewed and updated as appropriate: allergies, current medications, past family history, past medical history,  past surgical history, past social history  and problem list.   Visual acuity was not assessed per patient preference since the patient has  regular follow up with an  ophthalmologist. Hearing and body mass index were assessed and reviewed.    During the course of the visit the patient was educated and counseled about appropriate screening and preventive services including : fall prevention , diabetes screening, nutrition counseling, colorectal cancer screening, and recommended immunizations.    Chief Complaint:  Recent 12 hour episode of nausea vomiting and diarrhea ,  occurred after eating grocery store sushi /handroll that contained spicy mayo.   Joints ached afterward for a few days.    Hives:  chronic,  with current flare including a malar rash.  Takingn zolair monthly  anthistamine  4 times daily  and famotidine       Review of Symptoms  Patient denies headache, fevers, malaise, unintentional weight loss, skin rash, eye pain, sinus congestion and sinus pain, sore throat, dysphagia,  hemoptysis , cough, dyspnea, wheezing, chest pain, palpitations, orthopnea, edema, abdominal pain, nausea, melena, diarrhea, constipation, flank pain, dysuria, hematuria, urinary  Frequency, nocturia, numbness, tingling, seizures,  Focal weakness, Loss of consciousness,  Tremor, insomnia, depression, anxiety, and suicidal ideation.    Physical Exam:  BP 102/66   Pulse (!) 56   Ht 5\' 2"  (1.575 m)   Wt 112 lb 9.6 oz (51.1 kg)   SpO2 99%   BMI 20.59 kg/m    Physical Exam Vitals reviewed.  Constitutional:      General: She is not in acute distress.    Appearance: Normal appearance. She  is well-developed and normal weight. She is not ill-appearing, toxic-appearing or diaphoretic.  HENT:     Head: Normocephalic.      Comments: Malar rash     Right Ear: Tympanic membrane, ear canal and external ear normal. There is no impacted cerumen.     Left Ear: Tympanic membrane, ear canal and external ear normal. There is no impacted cerumen.     Nose: Nose normal.     Mouth/Throat:     Mouth: Mucous membranes are moist.     Pharynx: Oropharynx is  clear.  Eyes:     General: No scleral icterus.       Right eye: No discharge.        Left eye: No discharge.     Conjunctiva/sclera: Conjunctivae normal.     Pupils: Pupils are equal, round, and reactive to light.  Neck:     Thyroid: No thyromegaly.     Vascular: No carotid bruit or JVD.  Cardiovascular:     Rate and Rhythm: Normal rate and regular rhythm.     Heart sounds: Normal heart sounds.  Pulmonary:     Effort: Pulmonary effort is normal. No respiratory distress.     Breath sounds: Normal breath sounds.  Chest:  Breasts:    Breasts are symmetrical.     Right: Absent. No swelling, inverted nipple, mass, nipple discharge, skin change or tenderness.     Left: Absent. No swelling, inverted nipple, mass, nipple discharge, skin change or tenderness.     Comments: Bilateral mastectomy with implants  Abdominal:     General: Bowel sounds are normal.     Palpations: Abdomen is soft. There is no mass.     Tenderness: There is no abdominal tenderness. There is no guarding or rebound.  Musculoskeletal:        General: Normal range of motion.     Cervical back: Normal range of motion and neck supple.  Lymphadenopathy:     Cervical: No cervical adenopathy.     Upper Body:     Right upper body: No supraclavicular, axillary or pectoral adenopathy.     Left upper body: No supraclavicular, axillary or pectoral adenopathy.  Skin:    General: Skin is warm and dry.  Neurological:     General: No focal deficit present.     Mental Status: She is alert and oriented to person, place, and time. Mental status is at baseline.  Psychiatric:        Mood and Affect: Mood normal.        Behavior: Behavior normal.        Thought Content: Thought content normal.        Judgment: Judgment normal.    Assessment and Plan: Encounter for general adult medical examination with abnormal findings Assessment & Plan: age appropriate education and counseling updated, referrals for preventative services and  immunizations addressed, dietary and smoking counseling addressed, most recent labs reviewed.  I have personally reviewed and have noted:   1) the patient's medical and social history 2) The pt's use of alcohol, tobacco, and illicit drugs 3) The patient's current medications and supplements 4) Functional ability including ADL's, fall risk, home safety risk, hearing and visual impairment 5) Diet and physical activities 6) Evidence for depression or mood disorder 7) The patient's height, weight, and BMI have been recorded in the chart   I have made referrals, and provided counseling and education based on review of the above    Fatigue, unspecified type -  CBC with Differential/Platelet -     TSH  Hyperlipidemia with target LDL less than 160 -     Lipid panel -     LDL cholesterol, direct  Impaired fasting glucose -     Comprehensive metabolic panel -     Hemoglobin A1c  Osteopenia due to cancer therapy Assessment & Plan: 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   Small supply of alprazolam for prn use    Other orders -     FLUoxetine HCl; Take 1 capsule (10 mg total) by mouth daily.  Dispense: 90 capsule; Refill: 1 -     Gabapentin; Take 1 capsule (100 mg total) by mouth at bedtime.  Dispense: 90 capsule; Refill: 3 -     Scopolamine; Place 1 patch (1.5 mg total) onto the skin every 3 (three) days.  Dispense: 4 patch; Refill: 0 -     predniSONE; 6 tablets on Day 1 , then reduce by 1 tablet daily until gone   Dispense: 21 tablet; Refill: 0 -     Ondansetron; Take 1 tablet (4 mg total) by mouth every 8 (eight) hours as needed for nausea or vomiting.  Dispense: 20 tablet; Refill: 0 -     ALPRAZolam; Take 1 tablet (0.25 mg total) by mouth 2 (two) times daily as needed for anxiety.  Dispense: 20 tablet; Refill: 0 -     Promethazine HCl; Place 1 suppository (12.5 mg total) rectally every 6 (six) hours as needed for nausea or vomiting.  Dispense: 12 each; Refill: 0    No follow-ups on file.  Sherlene Shams, MD

## 2024-01-18 NOTE — Assessment & Plan Note (Signed)

## 2024-01-18 NOTE — Assessment & Plan Note (Signed)
 Checking ANA, ESR CBC

## 2024-01-18 NOTE — Assessment & Plan Note (Signed)
Continue low dose gabapentin

## 2024-01-18 NOTE — Assessment & Plan Note (Signed)
 Checking mg after recent GI illness with dehydration

## 2024-01-18 NOTE — Assessment & Plan Note (Signed)
 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

## 2024-01-18 NOTE — Patient Instructions (Addendum)
  You might want to try using Relaxium for insomnia  (as seen on TV commercials) . It is available through [employer] and contains all natural supplements:  Melatonin 5 mg  Chamomile 25 mg Passionflower extract 75 mg GABA 100 mg Ashwaganda extract 125 mg Magnesium citrate, glycinate, oxide (100 mg)  L tryptophan 500 mg Valerest (proprietary  ingredient ; probably valeria root extract)    Alprazolam, phenergan suppository, zofran ODT and scopalamine patches sent  Gabapentin refilled  Prednisone taper for the hives

## 2024-01-18 NOTE — Assessment & Plan Note (Signed)
 Advised to try relaxium.   Small supply of alprazolam for prn use

## 2024-01-19 LAB — TSH: TSH: 1.83 u[IU]/mL (ref 0.35–5.50)

## 2024-01-19 LAB — ANA W/REFLEX IF POSITIVE: Anti Nuclear Antibody (ANA): NEGATIVE

## 2024-01-20 ENCOUNTER — Encounter: Payer: Self-pay | Admitting: Internal Medicine

## 2024-01-20 NOTE — Addendum Note (Signed)
 Addended by: Sherlene Shams on: 01/20/2024 07:11 AM   Modules accepted: Orders
